# Patient Record
Sex: Female | Born: 1970 | Race: Black or African American | Hispanic: No | Marital: Married | Smoking: Never smoker
Health system: Southern US, Community
[De-identification: ages and names within clinical notes are randomized; demographics above are authoritative.]

## PROBLEM LIST (undated history)

## (undated) ENCOUNTER — Inpatient Hospital Stay (HOSPITAL_COMMUNITY): Payer: Self-pay

## (undated) DIAGNOSIS — F32A Depression, unspecified: Secondary | ICD-10-CM

## (undated) DIAGNOSIS — F419 Anxiety disorder, unspecified: Secondary | ICD-10-CM

## (undated) DIAGNOSIS — T7840XA Allergy, unspecified, initial encounter: Secondary | ICD-10-CM

## (undated) DIAGNOSIS — Z789 Other specified health status: Secondary | ICD-10-CM

## (undated) DIAGNOSIS — F329 Major depressive disorder, single episode, unspecified: Secondary | ICD-10-CM

## (undated) HISTORY — DX: Anxiety disorder, unspecified: F41.9

## (undated) HISTORY — PX: WISDOM TOOTH EXTRACTION: SHX21

## (undated) HISTORY — PX: HIATAL HERNIA REPAIR: SHX195

## (undated) HISTORY — DX: Allergy, unspecified, initial encounter: T78.40XA

---

## 1998-09-08 ENCOUNTER — Other Ambulatory Visit: Admission: RE | Admit: 1998-09-08 | Discharge: 1998-09-08 | Payer: Self-pay | Admitting: Obstetrics and Gynecology

## 1999-08-01 ENCOUNTER — Other Ambulatory Visit: Admission: RE | Admit: 1999-08-01 | Discharge: 1999-08-01 | Payer: Self-pay | Admitting: Obstetrics and Gynecology

## 2000-11-06 ENCOUNTER — Other Ambulatory Visit: Admission: RE | Admit: 2000-11-06 | Discharge: 2000-11-06 | Payer: Self-pay | Admitting: Obstetrics and Gynecology

## 2002-02-04 ENCOUNTER — Encounter: Admission: RE | Admit: 2002-02-04 | Discharge: 2002-02-04 | Payer: Self-pay | Admitting: Internal Medicine

## 2002-02-04 ENCOUNTER — Encounter: Payer: Self-pay | Admitting: Internal Medicine

## 2002-08-05 ENCOUNTER — Emergency Department (HOSPITAL_COMMUNITY): Admission: EM | Admit: 2002-08-05 | Discharge: 2002-08-06 | Payer: Self-pay | Admitting: Emergency Medicine

## 2005-03-27 ENCOUNTER — Encounter: Admission: RE | Admit: 2005-03-27 | Discharge: 2005-03-27 | Payer: Self-pay | Admitting: Obstetrics and Gynecology

## 2006-02-06 ENCOUNTER — Emergency Department (HOSPITAL_COMMUNITY): Admission: EM | Admit: 2006-02-06 | Discharge: 2006-02-06 | Payer: Self-pay | Admitting: Emergency Medicine

## 2007-06-03 ENCOUNTER — Emergency Department (HOSPITAL_COMMUNITY): Admission: EM | Admit: 2007-06-03 | Discharge: 2007-06-03 | Payer: Self-pay | Admitting: Emergency Medicine

## 2008-06-02 ENCOUNTER — Emergency Department (HOSPITAL_COMMUNITY): Admission: EM | Admit: 2008-06-02 | Discharge: 2008-06-02 | Payer: Self-pay | Admitting: Family Medicine

## 2008-07-22 ENCOUNTER — Ambulatory Visit (HOSPITAL_COMMUNITY): Admission: RE | Admit: 2008-07-22 | Discharge: 2008-07-22 | Payer: Self-pay | Admitting: Cardiovascular Disease

## 2008-09-22 ENCOUNTER — Emergency Department (HOSPITAL_COMMUNITY): Admission: EM | Admit: 2008-09-22 | Discharge: 2008-09-22 | Payer: Self-pay | Admitting: Family Medicine

## 2009-03-23 ENCOUNTER — Emergency Department (HOSPITAL_COMMUNITY): Admission: EM | Admit: 2009-03-23 | Discharge: 2009-03-23 | Payer: Self-pay | Admitting: Emergency Medicine

## 2010-05-13 LAB — POCT RAPID STREP A (OFFICE): Streptococcus, Group A Screen (Direct): NEGATIVE

## 2011-11-26 ENCOUNTER — Inpatient Hospital Stay (HOSPITAL_COMMUNITY)
Admission: AD | Admit: 2011-11-26 | Discharge: 2011-11-26 | Disposition: A | Discharge status: Home / Self Care | Payer: Managed Care, Other (non HMO) | Source: Ambulatory Visit | Attending: Obstetrics and Gynecology | Admitting: Obstetrics and Gynecology

## 2011-11-26 ENCOUNTER — Encounter (HOSPITAL_COMMUNITY): Payer: Self-pay | Admitting: *Deleted

## 2011-11-26 DIAGNOSIS — O200 Threatened abortion: Secondary | ICD-10-CM

## 2011-11-26 DIAGNOSIS — O209 Hemorrhage in early pregnancy, unspecified: Secondary | ICD-10-CM | POA: Insufficient documentation

## 2011-11-26 HISTORY — DX: Other specified health status: Z78.9

## 2011-11-26 LAB — URINALYSIS, ROUTINE W REFLEX MICROSCOPIC
Bilirubin Urine: NEGATIVE
Glucose, UA: NEGATIVE mg/dL
Ketones, ur: NEGATIVE mg/dL
Leukocytes, UA: NEGATIVE
Nitrite: NEGATIVE
Protein, ur: NEGATIVE mg/dL
Specific Gravity, Urine: 1.01 (ref 1.005–1.030)
Urobilinogen, UA: 0.2 mg/dL (ref 0.0–1.0)
pH: 8.5 — ABNORMAL HIGH (ref 5.0–8.0)

## 2011-11-26 LAB — WET PREP, GENITAL
Clue Cells Wet Prep HPF POC: NONE SEEN
Trich, Wet Prep: NONE SEEN
Yeast Wet Prep HPF POC: NONE SEEN

## 2011-11-26 NOTE — MAU Note (Signed)
Pt reports at 0100, when she went to the restroom she wiped and there was blood. Mild cramping,

## 2011-11-26 NOTE — MAU Note (Signed)
Bedside scan done. Two gestational sacs and both with cardiac activity seen.

## 2011-11-26 NOTE — MAU Provider Note (Signed)
Chief Complaint: Vaginal Bleeding   First Provider Initiated Contact with Patient 11/26/11 0249     SUBJECTIVE HPI: Kelli Rivers is a 41 y.o. G1P0 at [redacted]w[redacted]d by LMP (viable twin IUP verified by [place] 1 week ago per pt) who presents with light bleeding when she wiped at 0100 and mild cramping. Denies passage of tissue, vaginal discharge, hematuria, urinary Sx, recent intercourse. 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 Blood type A pos per pt.   Past Medical History  Diagnosis Date  . No pertinent past medical history    OB History    Grav Para Term Preterm Abortions TAB SAB Ect Mult Living   1              # Outc Date GA Lbr Len/2nd Wgt Sex Del Anes PTL Lv   1 CUR              Past Surgical History  Procedure Date  . Wisdom tooth extraction   . Hiatal hernia repair   . Hiatal hernia repair    History   Social History  . Marital Status: Married    Spouse Name: N/A    Number of Children: N/A  . Years of Education: N/A   Occupational History  . Not on file.   Social History Main Topics  . Smoking status: Never Smoker   . Smokeless tobacco: Not on file  . Alcohol Use: No  . Drug Use: No  . Sexually Active: Not Currently   Other Topics Concern  . Not on file   Social History Narrative  . No narrative on file   No current facility-administered medications on file prior to encounter.   No current outpatient prescriptions on file prior to encounter.   No Known Allergies  ROS: Pertinent items in HPI  OBJECTIVE Blood pressure 123/75, pulse 95, temperature 98.3 F (36.8 C), temperature source Oral, resp. rate 18, height 5\' 3"  (1.6 m), weight 49.442 kg (109 lb), last menstrual period 10/09/2011, SpO2 100.00%. GENERAL: Well-developed, well-nourished female in no acute distress.  HEENT: Normocephalic HEART: normal rate RESP: normal effort ABDOMEN: Soft, non-tender EXTREMITIES: Nontender, no edema NEURO: Alert and oriented SPECULUM EXAM: NEFG, small amount of  pink blood noted, cervix 0.5 cm ectropion, non-friable, Nabothian cyst at 2 o'clock. No active bleeding. No vaginal lesions or cervical polyps. BIMANUAL: cervix long and closed; uterus slightly enlarged, NT, no adnexal tenderness or masses  LAB RESULTS Results for orders placed during the hospital encounter of 11/26/11 (from the past 24 hour(s))  URINALYSIS, ROUTINE W REFLEX MICROSCOPIC     Status: Abnormal   Collection Time   11/26/11  1:50 AM      Component Value Range   Color, Urine YELLOW  YELLOW   APPearance HAZY (*) CLEAR   Specific Gravity, Urine 1.010  1.005 - 1.030   pH 8.5 (*) 5.0 - 8.0   Glucose, UA NEGATIVE  NEGATIVE mg/dL   Hgb urine dipstick LARGE (*) NEGATIVE   Bilirubin Urine NEGATIVE  NEGATIVE   Ketones, ur NEGATIVE  NEGATIVE mg/dL   Protein, ur NEGATIVE  NEGATIVE mg/dL   Urobilinogen, UA 0.2  0.0 - 1.0 mg/dL   Nitrite NEGATIVE  NEGATIVE   Leukocytes, UA NEGATIVE  NEGATIVE  URINE MICROSCOPIC-ADD ON     Status: Abnormal   Collection Time   11/26/11  1:50 AM      Component Value Range   Squamous Epithelial / LPF FEW (*) RARE   WBC, UA 0-2  <  3 WBC/hpf   RBC / HPF 3-6  <3 RBC/hpf  POCT PREGNANCY, URINE     Status: Abnormal   Collection Time   11/26/11  1:59 AM      Component Value Range   Preg Test, Ur POSITIVE (*) NEGATIVE  WET PREP, GENITAL     Status: Abnormal   Collection Time   11/26/11  3:00 AM      Component Value Range   Yeast Wet Prep HPF POC NONE SEEN  NONE SEEN   Trich, Wet Prep NONE SEEN  NONE SEEN   Clue Cells Wet Prep HPF POC NONE SEEN  NONE SEEN   WBC, Wet Prep HPF POC FEW (*) NONE SEEN    IMAGING FHR x 2 seen by informal BS [place].   MAU COURSE  ASSESSMENT No diagnosis found.  PLAN Discharge home per consult w/ Dr. Senaida Ores Pelvic rest x 1 week Bleeding precautions ABO/Rh pending Follow-up Information    Follow up with Fermin Schwab, MD. In 1 week. (as scheduled )    Contact information:   [facility]  [address] [place] [address] [phone]       Follow up with [facility] [place] [facility]ERN[facility] AD[facility]. (As needed if symptoms worsen)    Contact information:   [address] [id number] mc [place] [address] [phone]          Medication List     As of 11/26/2011  3:23 AM    ASK your doctor about these medications         folic acid 800 MCG tablet   Commonly known as: FOLVITE   Take 400 mcg by mouth daily.      prenatal multivitamin Tabs   Take 1 tablet by mouth daily.         [place], CNM 11/26/2011  3:23 AM

## 2011-11-27 LAB — GC/CHLAMYDIA PROBE AMP, GENITAL
Chlamydia, DNA Probe: NEGATIVE
GC Probe Amp, Genital: NEGATIVE

## 2011-12-18 LAB — OB RESULTS CONSOLE RUBELLA ANTIBODY, IGM: Rubella: IMMUNE

## 2011-12-18 LAB — OB RESULTS CONSOLE ANTIBODY SCREEN: Antibody Screen: NEGATIVE

## 2011-12-18 LAB — OB RESULTS CONSOLE HEPATITIS B SURFACE ANTIGEN: Hepatitis B Surface Ag: NEGATIVE

## 2011-12-18 LAB — OB RESULTS CONSOLE ABO/RH: RH Type: POSITIVE

## 2012-06-04 ENCOUNTER — Other Ambulatory Visit (HOSPITAL_COMMUNITY): Payer: Self-pay | Admitting: Obstetrics and Gynecology

## 2012-06-11 ENCOUNTER — Inpatient Hospital Stay (HOSPITAL_COMMUNITY): Payer: Managed Care, Other (non HMO)

## 2012-06-11 ENCOUNTER — Encounter (HOSPITAL_COMMUNITY): Payer: Self-pay | Admitting: Anesthesiology

## 2012-06-11 ENCOUNTER — Encounter (HOSPITAL_COMMUNITY): Payer: Self-pay

## 2012-06-11 ENCOUNTER — Inpatient Hospital Stay (HOSPITAL_COMMUNITY)
Admission: AD | Admit: 2012-06-11 | Discharge: 2012-06-15 | DRG: 765 | Disposition: A | Discharge status: Home / Self Care | Payer: Managed Care, Other (non HMO) | Source: Ambulatory Visit | Attending: Obstetrics and Gynecology | Admitting: Obstetrics and Gynecology

## 2012-06-11 ENCOUNTER — Inpatient Hospital Stay (HOSPITAL_COMMUNITY): Payer: Managed Care, Other (non HMO) | Admitting: Anesthesiology

## 2012-06-11 ENCOUNTER — Encounter (HOSPITAL_COMMUNITY)
Admission: AD | Disposition: A | Discharge status: Home / Self Care | Payer: Self-pay | Source: Ambulatory Visit | Attending: Obstetrics and Gynecology

## 2012-06-11 DIAGNOSIS — O30049 Twin pregnancy, dichorionic/diamniotic, unspecified trimester: Secondary | ICD-10-CM

## 2012-06-11 DIAGNOSIS — D252 Subserosal leiomyoma of uterus: Secondary | ICD-10-CM | POA: Diagnosis present

## 2012-06-11 DIAGNOSIS — O3090 Multiple gestation, unspecified, unspecified trimester: Principal | ICD-10-CM | POA: Diagnosis present

## 2012-06-11 DIAGNOSIS — D4959 Neoplasm of unspecified behavior of other genitourinary organ: Secondary | ICD-10-CM | POA: Diagnosis present

## 2012-06-11 DIAGNOSIS — O09529 Supervision of elderly multigravida, unspecified trimester: Secondary | ICD-10-CM | POA: Diagnosis present

## 2012-06-11 DIAGNOSIS — O34599 Maternal care for other abnormalities of gravid uterus, unspecified trimester: Secondary | ICD-10-CM | POA: Diagnosis present

## 2012-06-11 DIAGNOSIS — Z98891 History of uterine scar from previous surgery: Secondary | ICD-10-CM

## 2012-06-11 DIAGNOSIS — O30009 Twin pregnancy, unspecified number of placenta and unspecified number of amniotic sacs, unspecified trimester: Secondary | ICD-10-CM

## 2012-06-11 HISTORY — DX: Other specified health status: Z78.9

## 2012-06-11 LAB — CBC
MCH: 29.3 pg (ref 26.0–34.0)
MCHC: 34.1 g/dL (ref 30.0–36.0)
MCV: 85.9 fL (ref 78.0–100.0)
Platelets: 105 10*3/uL — ABNORMAL LOW (ref 150–400)
RDW: 17.9 % — ABNORMAL HIGH (ref 11.5–15.5)

## 2012-06-11 LAB — TYPE AND SCREEN: ABO/RH(D): A POS

## 2012-06-11 SURGERY — Surgical Case
Anesthesia: Spinal | Site: Abdomen | Wound class: Clean Contaminated

## 2012-06-11 MED ORDER — FAMOTIDINE IN NACL 20-0.9 MG/50ML-% IV SOLN
20.0000 mg | Freq: Once | INTRAVENOUS | Status: AC
  Administered 2012-06-11: 20 mg via INTRAVENOUS
  Filled 2012-06-11: qty 50

## 2012-06-11 MED ORDER — CITRIC ACID-SODIUM CITRATE 334-500 MG/5ML PO SOLN
ORAL | Status: AC
  Filled 2012-06-11: qty 15

## 2012-06-11 MED ORDER — CITRIC ACID-SODIUM CITRATE 334-500 MG/5ML PO SOLN: 30.0000 mL | Freq: Once | ORAL | Status: DC

## 2012-06-11 MED ORDER — LACTATED RINGERS IV SOLN
INTRAVENOUS | Status: DC
  Administered 2012-06-11: 125 mL/h via INTRAVENOUS
  Administered 2012-06-12 (×2): via INTRAVENOUS

## 2012-06-11 MED ORDER — FENTANYL CITRATE 0.05 MG/ML IJ SOLN
INTRAMUSCULAR | Status: DC | PRN
  Administered 2012-06-11: 25 ug via INTRATHECAL
  Administered 2012-06-12: 25 ug via INTRAVENOUS
  Administered 2012-06-12: 50 ug via INTRAVENOUS

## 2012-06-11 MED ORDER — MORPHINE SULFATE (PF) 0.5 MG/ML IJ SOLN
INTRAMUSCULAR | Status: DC | PRN
  Administered 2012-06-11: 150 ug via INTRATHECAL

## 2012-06-11 MED ORDER — BUPIVACAINE IN DEXTROSE 0.75-8.25 % IT SOLN
INTRATHECAL | Status: DC | PRN
  Administered 2012-06-11: 12 mg via INTRATHECAL

## 2012-06-11 MED ORDER — CEFAZOLIN SODIUM-DEXTROSE 2-3 GM-% IV SOLR
2.0000 g | Freq: Three times a day (TID) | INTRAVENOUS | Status: DC
  Administered 2012-06-11: 2 g via INTRAVENOUS
  Filled 2012-06-11 (×2): qty 50

## 2012-06-11 SURGICAL SUPPLY — 33 items
CLAMP CORD UMBIL (MISCELLANEOUS) ×6 IMPLANT
CLOTH BEACON ORANGE TIMEOUT ST (SAFETY) ×2 IMPLANT
CONTAINER PREFILL 10% NBF 15ML (MISCELLANEOUS) IMPLANT
DRAPE LG THREE QUARTER DISP (DRAPES) ×4 IMPLANT
DRSG OPSITE POSTOP 4X10 (GAUZE/BANDAGES/DRESSINGS) ×2 IMPLANT
DRSG VASELINE 3X18 (GAUZE/BANDAGES/DRESSINGS) ×2 IMPLANT
DURAPREP 26ML APPLICATOR (WOUND CARE) ×2 IMPLANT
ELECT REM PT RETURN 9FT ADLT (ELECTROSURGICAL) ×2
ELECTRODE REM PT RTRN 9FT ADLT (ELECTROSURGICAL) ×1 IMPLANT
EXTRACTOR VACUUM KIWI (MISCELLANEOUS) IMPLANT
EXTRACTOR VACUUM M CUP 4 TUBE (SUCTIONS) IMPLANT
GLOVE BIO SURGEON STRL SZ7.5 (GLOVE) ×2 IMPLANT
GOWN PREVENTION PLUS XLARGE (GOWN DISPOSABLE) ×2 IMPLANT
GOWN STRL REIN XL XLG (GOWN DISPOSABLE) ×4 IMPLANT
KIT ABG SYR 3ML LUER SLIP (SYRINGE) IMPLANT
NEEDLE HYPO 25X5/8 SAFETYGLIDE (NEEDLE) IMPLANT
NS IRRIG 1000ML POUR BTL (IV SOLUTION) ×2 IMPLANT
PACK C SECTION WH (CUSTOM PROCEDURE TRAY) ×2 IMPLANT
PAD OB MATERNITY 4.3X12.25 (PERSONAL CARE ITEMS) ×2 IMPLANT
RTRCTR C-SECT PINK 25CM LRG (MISCELLANEOUS) ×2 IMPLANT
SLEEVE SCD COMPRESS KNEE MED (MISCELLANEOUS) ×2 IMPLANT
STAPLER VISISTAT 35W (STAPLE) ×2 IMPLANT
SUT PLAIN 0 NONE (SUTURE) IMPLANT
SUT VIC AB 0 CT1 36 (SUTURE) ×12 IMPLANT
SUT VIC AB 3-0 CTX 36 (SUTURE) ×2 IMPLANT
SUT VIC AB 3-0 SH 27 (SUTURE)
SUT VIC AB 3-0 SH 27X BRD (SUTURE) IMPLANT
SUT VIC AB 4-0 KS 27 (SUTURE) IMPLANT
SUT VICRYL 0 TIES 12 18 (SUTURE) IMPLANT
SYR BULB 3OZ (MISCELLANEOUS) ×2 IMPLANT
TOWEL OR 17X24 6PK STRL BLUE (TOWEL DISPOSABLE) ×6 IMPLANT
TRAY FOLEY CATH 14FR (SET/KITS/TRAYS/PACK) ×2 IMPLANT
WATER STERILE IRR 1000ML POUR (IV SOLUTION) IMPLANT

## 2012-06-11 NOTE — MAU Note (Signed)
Pt reports she is 35 weeks with twins, having NST's and ultrasounds due to IUGR twin B. After visit today states she began having bleeding. Has not filled a pad yet.

## 2012-06-11 NOTE — Anesthesia Procedure Notes (Signed)
Spinal  Patient location during procedure: OR Start time: 06/11/2012 11:51 PM Staffing Anesthesiologist: Angus Seller., Harrell Gave. Performed by: anesthesiologist  Preanesthetic Checklist Completed: patient identified, site marked, surgical consent, pre-op evaluation, timeout performed, IV checked, risks and benefits discussed and monitors and equipment checked Spinal Block Patient position: sitting Prep: DuraPrep Patient monitoring: heart rate, cardiac monitor, continuous pulse ox and blood pressure Approach: midline Location: L3-4 Injection technique: single-shot Needle Needle type: Sprotte  Needle gauge: 24 G Needle length: 9 cm Assessment Sensory level: T4 Additional Notes Patient identified.  Risk benefits discussed including failed block, incomplete pain control, headache, nerve damage, paralysis, blood pressure changes, nausea, vomiting, reactions to medication both toxic or allergic, and postpartum back pain.  Patient expressed understanding and wished to proceed.  All questions were answered.  Sterile technique used throughout procedure.  CSF was clear.  No parasthesia or other complications.  Please see nursing notes for vital signs.

## 2012-06-11 NOTE — MAU Provider Note (Signed)
Chief Complaint:  Vaginal Bleeding  First Provider Initiated Contact with Patient 06/11/12 2158     HPI: Kelli Rivers is a 42 y.o. G1P0 at [redacted]w[redacted]d who presents to maternity admissions reporting red bleeding like a period this evening that had decreased and turned brown. Has not saturated a pad. Also reports worsening low abd cramping. Denies leakage of fluid. Good fetal movement.   Pregnancy Course: Twin pregnancy w/ growth discordance.  Past Medical History: Past Medical History  Diagnosis Date  . No pertinent past medical history   . Medical history non-contributory     Past obstetric history: OB History   Grav Para Term Preterm Abortions TAB SAB Ect Mult Living   1              # Outc Date GA Lbr Len/2nd Wgt Sex Del Anes PTL Lv   1 CUR               Past Surgical History: Past Surgical History  Procedure Laterality Date  . Wisdom tooth extraction    . Hiatal hernia repair    . Hiatal hernia repair      Family History: Family History  Problem Relation Age of Onset  . Hypertension Mother   . Hyperlipidemia Mother   . Thyroid disease Mother   . Hypertension Father   . Hyperlipidemia Father   . Heart disease Father   . Diabetes Maternal Aunt   . Diabetes Maternal Uncle   . Diabetes Paternal Aunt   . Diabetes Paternal Uncle   . Cancer Maternal Grandmother   . Diabetes Paternal Grandmother     Social History: History  Substance Use Topics  . Smoking status: Never Smoker   . Smokeless tobacco: Not on file  . Alcohol Use: No    Allergies:  Allergies  Allergen Reactions  . Vicodin (Hydrocodone-Acetaminophen) Nausea Only    Meds:  Prescriptions prior to admission  Medication Sig Dispense Refill  . Ferrous Sulfate 142 (45 FE) MG TBCR Take by mouth.      . folic acid (FOLVITE) 800 MCG tablet Take 400 mcg by mouth daily.      . Prenatal Vit-Fe Fumarate-FA (PRENATAL MULTIVITAMIN) TABS Take 1 tablet by mouth daily.        ROS: Pertinent findings in  history of present illness.  Physical Exam  Blood pressure 144/82, pulse 111, temperature 97.8 F (36.6 C), temperature source Oral, resp. rate 20, height 5\' 4"  (1.626 m), weight 58.968 kg (130 lb), last menstrual period 10/09/2011, SpO2 100.00%. GENERAL: Well-developed, well-nourished female in mild distress.  HEENT: normocephalic HEART: normal rate RESP: normal effort ABDOMEN: Soft, non-tender, gravid appropriate for gestational age EXTREMITIES: Nontender, no edema NEURO: alert and oriented SPECULUM EXAM: NEFG,small amount of old blood-mucus, cervix incompletely visualized, but engorged, friable.  Dilation: 3 Effacement (%): 80 Exam by:: V Jenilee Franey CNM Baby A Vertex  FHT:   A Baseline 130 , moderate variability, accelerations present, no decelerations  B Baseline 140 , moderate variability, accelerations present, mild variable decelerations Contractions: q 2-5 mins, mild   Labs: NA  Imaging:  Vtx/Breech  MAU Course: Dr. Ambrose Mantle came to assess pt. Cervix now 4 cm.   Assessment: Active labor Bloody show  Plan: Prep for OR per Dr. Ambrose Mantle.  [place], [place] 06/11/2012 10:29 PM

## 2012-06-11 NOTE — Anesthesia Preprocedure Evaluation (Signed)
Anesthesia Evaluation  Patient identified by MRN, date of birth, ID band Patient awake    Reviewed: Allergy & Precautions, H&P , NPO status , Patient's Chart, lab work & pertinent test results  Airway Mallampati: II      Dental no notable dental hx.    Pulmonary neg pulmonary ROS,  breath sounds clear to auscultation  Pulmonary exam normal       Cardiovascular Exercise Tolerance: Good negative cardio ROS  Rhythm:regular Rate:Normal     Neuro/Psych negative neurological ROS  negative psych ROS   GI/Hepatic negative GI ROS, Neg liver ROS,   Endo/Other  negative endocrine ROS  Renal/GU negative Renal ROS  negative genitourinary   Musculoskeletal   Abdominal Normal abdominal exam  (+)   Peds  Hematology negative hematology ROS (+)   Anesthesia Other Findings   Reproductive/Obstetrics (+) Pregnancy                           Anesthesia Physical Anesthesia Plan  ASA: II and emergent  Anesthesia Plan: Spinal   Post-op Pain Management:    Induction:   Airway Management Planned:   Additional Equipment:   Intra-op Plan:   Post-operative Plan:   Informed Consent: I have reviewed the patients History and Physical, chart, labs and discussed the procedure including the risks, benefits and alternatives for the proposed anesthesia with the patient or authorized representative who has indicated his/her understanding and acceptance.     Plan Discussed with: Anesthesiologist, CRNA and Surgeon  Anesthesia Plan Comments:         Anesthesia Quick Evaluation  

## 2012-06-12 LAB — RPR: RPR Ser Ql: NONREACTIVE

## 2012-06-12 MED ORDER — NALOXONE HCL 1 MG/ML IJ SOLN
1.0000 ug/kg/h | INTRAVENOUS | Status: DC | PRN
  Filled 2012-06-12: qty 2

## 2012-06-12 MED ORDER — ONDANSETRON HCL 4 MG PO TABS: 4.0000 mg | ORAL_TABLET | ORAL | Status: DC | PRN

## 2012-06-12 MED ORDER — OXYTOCIN 40 UNITS IN LACTATED RINGERS INFUSION - SIMPLE MED: 62.5000 mL/h | INTRAVENOUS | Status: DC

## 2012-06-12 MED ORDER — SIMETHICONE 80 MG PO CHEW: 80.0000 mg | CHEWABLE_TABLET | ORAL | Status: DC | PRN

## 2012-06-12 MED ORDER — MEPERIDINE HCL 25 MG/ML IJ SOLN: 6.2500 mg | INTRAMUSCULAR | Status: DC | PRN

## 2012-06-12 MED ORDER — SIMETHICONE 80 MG PO CHEW
80.0000 mg | CHEWABLE_TABLET | Freq: Three times a day (TID) | ORAL | Status: DC
  Administered 2012-06-12 – 2012-06-15 (×12): 80 mg via ORAL

## 2012-06-12 MED ORDER — METOCLOPRAMIDE HCL 5 MG/ML IJ SOLN: 10.0000 mg | Freq: Three times a day (TID) | INTRAMUSCULAR | Status: DC | PRN

## 2012-06-12 MED ORDER — ACETAMINOPHEN 10 MG/ML IV SOLN
1000.0000 mg | Freq: Four times a day (QID) | INTRAVENOUS | Status: AC | PRN
  Administered 2012-06-12: 1000 mg via INTRAVENOUS
  Filled 2012-06-12: qty 100

## 2012-06-12 MED ORDER — LACTATED RINGERS IV SOLN: INTRAVENOUS | Status: DC

## 2012-06-12 MED ORDER — IBUPROFEN 600 MG PO TABS
600.0000 mg | ORAL_TABLET | Freq: Four times a day (QID) | ORAL | Status: DC
  Administered 2012-06-12: 600 mg via ORAL
  Filled 2012-06-12: qty 1

## 2012-06-12 MED ORDER — MEASLES, MUMPS & RUBELLA VAC ~~LOC~~ INJ
0.5000 mL | INJECTION | Freq: Once | SUBCUTANEOUS | Status: DC
  Filled 2012-06-12: qty 0.5

## 2012-06-12 MED ORDER — ONDANSETRON HCL 4 MG/2ML IJ SOLN: 4.0000 mg | Freq: Three times a day (TID) | INTRAMUSCULAR | Status: DC | PRN

## 2012-06-12 MED ORDER — ONDANSETRON HCL 4 MG/2ML IJ SOLN: 4.0000 mg | INTRAMUSCULAR | Status: DC | PRN

## 2012-06-12 MED ORDER — LANOLIN HYDROUS EX OINT: 1.0000 "application " | TOPICAL_OINTMENT | CUTANEOUS | Status: DC | PRN

## 2012-06-12 MED ORDER — DIBUCAINE 1 % RE OINT: 1.0000 "application " | TOPICAL_OINTMENT | RECTAL | Status: DC | PRN

## 2012-06-12 MED ORDER — LACTATED RINGERS IV SOLN
INTRAVENOUS | Status: DC
  Administered 2012-06-12: 08:00:00 via INTRAVENOUS

## 2012-06-12 MED ORDER — SCOPOLAMINE 1 MG/3DAYS TD PT72
1.0000 | MEDICATED_PATCH | Freq: Once | TRANSDERMAL | Status: AC
  Administered 2012-06-12: 1.5 mg via TRANSDERMAL

## 2012-06-12 MED ORDER — NALOXONE HCL 0.4 MG/ML IJ SOLN: 0.4000 mg | INTRAMUSCULAR | Status: DC | PRN

## 2012-06-12 MED ORDER — DIPHENHYDRAMINE HCL 25 MG PO CAPS: 25.0000 mg | ORAL_CAPSULE | Freq: Four times a day (QID) | ORAL | Status: DC | PRN

## 2012-06-12 MED ORDER — TETANUS-DIPHTH-ACELL PERTUSSIS 5-2.5-18.5 LF-MCG/0.5 IM SUSP: 0.5000 mL | Freq: Once | INTRAMUSCULAR | Status: DC

## 2012-06-12 MED ORDER — COMPLETENATE 29-1 MG PO CHEW
1.0000 | CHEWABLE_TABLET | Freq: Every day | ORAL | Status: DC
  Administered 2012-06-13 – 2012-06-14 (×2): 1 via ORAL
  Filled 2012-06-12 (×3): qty 1

## 2012-06-12 MED ORDER — OXYTOCIN 10 UNIT/ML IJ SOLN
40.0000 [IU] | INTRAVENOUS | Status: DC | PRN
  Administered 2012-06-12: 40 [IU] via INTRAVENOUS

## 2012-06-12 MED ORDER — DIPHENHYDRAMINE HCL 25 MG PO CAPS
25.0000 mg | ORAL_CAPSULE | ORAL | Status: DC | PRN
  Administered 2012-06-12 – 2012-06-13 (×2): 25 mg via ORAL
  Filled 2012-06-12 (×2): qty 1

## 2012-06-12 MED ORDER — IBUPROFEN 100 MG/5ML PO SUSP
600.0000 mg | Freq: Four times a day (QID) | ORAL | Status: DC
  Administered 2012-06-12 – 2012-06-15 (×11): 600 mg via ORAL
  Filled 2012-06-12 (×12): qty 30

## 2012-06-12 MED ORDER — WITCH HAZEL-GLYCERIN EX PADS: 1.0000 "application " | MEDICATED_PAD | CUTANEOUS | Status: DC | PRN

## 2012-06-12 MED ORDER — DIPHENHYDRAMINE HCL 50 MG/ML IJ SOLN: 25.0000 mg | INTRAMUSCULAR | Status: DC | PRN

## 2012-06-12 MED ORDER — CEFAZOLIN SODIUM 1-5 GM-% IV SOLN
1.0000 g | Freq: Three times a day (TID) | INTRAVENOUS | Status: AC
  Administered 2012-06-12 (×2): 1 g via INTRAVENOUS
  Filled 2012-06-12 (×2): qty 50

## 2012-06-12 MED ORDER — ONDANSETRON HCL 4 MG/2ML IJ SOLN
INTRAMUSCULAR | Status: DC | PRN
  Administered 2012-06-12: 4 mg via INTRAVENOUS

## 2012-06-12 MED ORDER — DIPHENHYDRAMINE HCL 50 MG/ML IJ SOLN: 12.5000 mg | INTRAMUSCULAR | Status: DC | PRN

## 2012-06-12 MED ORDER — FENTANYL CITRATE 0.05 MG/ML IJ SOLN: 25.0000 ug | INTRAMUSCULAR | Status: DC | PRN

## 2012-06-12 MED ORDER — SENNOSIDES-DOCUSATE SODIUM 8.6-50 MG PO TABS
2.0000 | ORAL_TABLET | Freq: Every day | ORAL | Status: DC
  Administered 2012-06-12 – 2012-06-14 (×3): 2 via ORAL

## 2012-06-12 MED ORDER — SODIUM CHLORIDE 0.9 % IJ SOLN: 3.0000 mL | INTRAMUSCULAR | Status: DC | PRN

## 2012-06-12 MED ORDER — NALBUPHINE SYRINGE 5 MG/0.5 ML
5.0000 mg | INJECTION | INTRAMUSCULAR | Status: DC | PRN
  Filled 2012-06-12: qty 1

## 2012-06-12 MED ORDER — ZOLPIDEM TARTRATE 5 MG PO TABS
5.0000 mg | ORAL_TABLET | Freq: Every evening | ORAL | Status: DC | PRN
  Administered 2012-06-13 – 2012-06-14 (×2): 5 mg via ORAL
  Filled 2012-06-12 (×2): qty 1

## 2012-06-12 MED ORDER — OXYCODONE-ACETAMINOPHEN 5-325 MG/5ML PO SOLN
5.0000 mL | ORAL | Status: DC | PRN
  Administered 2012-06-12 – 2012-06-15 (×9): 5 mL via ORAL
  Filled 2012-06-12 (×9): qty 5

## 2012-06-12 MED ORDER — 0.9 % SODIUM CHLORIDE (POUR BTL) OPTIME
TOPICAL | Status: DC | PRN
  Administered 2012-06-12: 1000 mL

## 2012-06-12 MED ORDER — PROMETHAZINE HCL 25 MG/ML IJ SOLN: 6.2500 mg | INTRAMUSCULAR | Status: DC | PRN

## 2012-06-12 MED ORDER — MIDAZOLAM HCL 2 MG/2ML IJ SOLN: 0.5000 mg | Freq: Once | INTRAMUSCULAR | Status: DC | PRN

## 2012-06-12 MED ORDER — SCOPOLAMINE 1 MG/3DAYS TD PT72
MEDICATED_PATCH | TRANSDERMAL | Status: AC
  Filled 2012-06-12: qty 1

## 2012-06-12 MED ORDER — PRENATAL MULTIVITAMIN CH
1.0000 | ORAL_TABLET | Freq: Every day | ORAL | Status: DC
  Administered 2012-06-12: 1 via ORAL
  Filled 2012-06-12: qty 1

## 2012-06-12 MED ORDER — OXYCODONE-ACETAMINOPHEN 5-325 MG PO TABS
1.0000 | ORAL_TABLET | ORAL | Status: DC | PRN
  Administered 2012-06-12: 1 via ORAL
  Filled 2012-06-12: qty 2

## 2012-06-12 MED ORDER — MENTHOL 3 MG MT LOZG: 1.0000 | LOZENGE | OROMUCOSAL | Status: DC | PRN

## 2012-06-12 NOTE — Progress Notes (Signed)
Post Partum Day 0 Subjective: no complaints, up ad lib and tolerating PO--visited patient in NICU with babies  Objective: Blood pressure 106/74, pulse 94, temperature 97.5 F (36.4 C), temperature source Oral, resp. rate 18, height 5\' 4"  (1.626 m), weight 58.968 kg (130 lb), last menstrual period 10/09/2011, SpO2 100.00%.  Physical Exam:  General: alert and cooperative Lochia: appropriate Uterine Fundus: firm Incision: C/D/I    Recent Labs  06/11/12 2230  HGB 12.9  HCT 37.8    Assessment/Plan: Doing well babies stable in NICU  LOS: 1 day   Crystie Yanko W 06/12/2012, 11:19 AM

## 2012-06-12 NOTE — Op Note (Signed)
Operative note on Kelli Rivers  Date of the operation: 06/12/2012  Preoperative diagnosis intrauterine pregnancy 35 weeks 4 days, twin gestation, discordancy in growth with twin a 26% larger than twin B, elevated Doppler in twin B, vertex breech presentation  Postoperative diagnosis: Same with small subserosal uterine fibroids  Operation: Low transverse cervical C-section, twin A delivered vertex, twin B delivered breech  Operator: Jayquon Theiler  Anesthesia: Spinal, Dr. Brayton Caves  The patient was brought to the operating room in labor with the cervix 3-4 cm dilated and placed on the operating room table. She was positioned in the sitting position for a spinal anesthetic. The spinal was done by Dr. Jean Rosenthal and the patient was placed supine and tilted to her left. The RN then placed a Foley catheter into the bladder under sterile conditions and prepped the abdominal wall with DuraPrep. A timeout was done and the DuraPrep was allowed to dry for 3 minutes. The abdomen was then draped as a sterile field and anesthesia was confirmed by pinching the lower abdomen with an Allis clamp. A transverse incision was made suprapubically through the skin subcutaneous tissue and fascia, the fascia was separated from the rectus muscle superiorly and inferiorly, the peritoneum was opened vertically, and an Alexis retractor was placed to expose the lower uterine segment. A short transverse incision was made into the lower uterine segment through the superficial layers of the myometrium. I entered the amniotic sac with my finger and obtained clear fluid. The vertex of twin A was elevated into the incisional opening and the infant was delivered. The cord was clamped and cut and I took the baby to Dr. Joana Reamer who was awaiting his arrival. She assigned the baby Apgars of 9 and 9 at one and 5 minutes.I returned to the operating room table, found M.D. To be in breech presentation with the right foot complete breech and the  left foot frank.The infant was delivered without difficulty,the cord was clamped and cut and I took the baby to Dr. Joana Reamer. She assigned the baby Apgars of 9 and 9 at one and 5 minutes. Routine cord blood studies were obtained from each cord and the placenta was delivered and preserved for pathology. The inside of the uterus was inspected and found to be free of any products of conception and a 2 and ovaries were inspected and found to be normal. There were some small fibroids no more than 5-6 mm on the serosal surface of the uterus. The uterine incision was closed in 2 layers using a running lock suture of 0 Vicryl the first layer and nonlocking suture of the same  material on the second layer. There was a small hematoma at the right uterine angle but this was controlled with suture. One extra figure-of-eight suture was required for hemostasis in the central portion of the incision. The retractor was removed, liberal irrigation confirmed hemostasis, and the abdominal wound was closed in layers using interrupted sutures of 0 Vicryl to close the rectus muscle and peritoneum in one layer, 2 running sutures of 0 Vicryl on  the fascia, a running 3-0 Vicryl on  the subcutaneous tissue and staples on the skin. A sterile dressing was applied and the procedure was terminated. Estimated blood loss no more than 800 cc's sponge and needle counts were correct and the patient was returned to recovery in satisfactory condition

## 2012-06-12 NOTE — Anesthesia Postprocedure Evaluation (Signed)
  Anesthesia Post Note  Patient: Kelli Rivers  Procedure(s) Performed: Procedure(s) (LRB): CESAREAN SECTION (N/A)  Anesthesia type: Spinal  Patient location: PACU  Post pain: Pain level controlled  Post assessment: Post-op Vital signs reviewed  Last Vitals:  Filed Vitals:   06/11/12 2134  BP: 106/70  Pulse: 90  Temp:   Resp:     Post vital signs: Reviewed  Level of consciousness: awake  Complications: No apparent anesthesia complications
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
## 2012-06-12 NOTE — Consult Note (Signed)
Neonatology Note:   Attendance at C-section:    I was asked by Dr. Ambrose Mantle to attend this primary C/S at 35 2/[redacted] weeks GA due to onset of labor, twin gestation, with breech presentation in Twin B. The mother is a G1P0 A pos, GBS not done yet, with sickle trait and twins that grew concordantly until recently, when Twin B was noted to be about 25% smaller than Twin A. Amniotic fluid volume has been normal for both and the BPP has been normal, also. MFM has been following and had recommended delivery at 37 weeks, but she presented this evening with vaginal bleeding and onset of labor. ROM at delivery, fluid clear.  Twin A was delivered vertex and was vigorous with good spontaneous cry and tone. Needed only minimal bulb suctioning. Ap 9/9. Lungs clear to ausc in DR, but marked periodic breathing was noted with HR decelerations, so he was transported to the NICU for monitoring and further care after being held briefly by his parnts.  Twin B was delivered frank breech and was also vigorous with good tone and cry at birth. He required only bulb suctioning and remained pink. Ap 9/9. Held briefly in the DR by his parents, then transported to the NICU for further care due to LBW.  The father was in attendance throughout. I spoke with both parents before and after delivery about the babies, their condition, and our plans for their treatment.   Doretha Sou, MD

## 2012-06-12 NOTE — Anesthesia Postprocedure Evaluation (Signed)
  Anesthesia Post-op Note  Patient: Kelli Rivers  Procedure(s) Performed: Procedure(s) with comments: CESAREAN SECTION (N/A) - Primary Cesarean Section Twins Baby "A" Boy @ 0009, Apgars 9/9,  Baby "B" Boy @ 0011,   Apgars 9/9  Patient Location: PACU and Women's Unit  Anesthesia Type:Spinal  Level of Consciousness: awake  Airway and Oxygen Therapy: Patient Spontanous Breathing  Post-op Pain: none  Post-op Assessment: Patient's Cardiovascular Status Stable, Respiratory Function Stable, Patent Airway, No signs of Nausea or vomiting, Adequate PO intake, Pain level controlled, No headache, No backache, No residual numbness and No residual motor weakness  Post-op Vital Signs: Reviewed and stable  Complications: No apparent anesthesia complications

## 2012-06-12 NOTE — Transfer of Care (Signed)
Immediate Anesthesia Transfer of Care Note  Patient: Kelli Rivers  Procedure(s) Performed: Procedure(s) with comments: CESAREAN SECTION (N/A) - Primary Cesarean Section Twins Baby "A" Boy @ 0009, Apgars 9/9,  Baby "B" Boy @ 0011,   Apgars 9/9  Patient Location: PACU  Anesthesia Type:Spinal  Level of Consciousness: awake, alert  and oriented  Airway & Oxygen Therapy: Patient Spontanous Breathing  Post-op Assessment: Report given to PACU RN and Post -op Vital signs reviewed and stable  Post vital signs: Reviewed and stable  Complications: No apparent anesthesia complications

## 2012-06-13 LAB — CBC
MCV: 86.4 fL (ref 78.0–100.0)
Platelets: 100 10*3/uL — ABNORMAL LOW (ref 150–400)
RDW: 18 % — ABNORMAL HIGH (ref 11.5–15.5)
WBC: 7.4 10*3/uL (ref 4.0–10.5)

## 2012-06-13 NOTE — Clinical Social Work Maternal (Signed)
Clinical Social Work Department PSYCHOSOCIAL ASSESSMENT - MATERNAL/CHILD 06/13/2012  Patient:  Kelli Rivers, Kelli Rivers  Account Number:  [id number]  Admit Date:  06/11/2012  Marjo Bicker Name:   Shanna Cisco II (A)  Lanier Clam (B)    Clinical Social Worker:  Lulu Riding, [place]   Date/Time:  06/13/2012 10:00 AM  Date Referred:  06/13/2012   Referral source  NICU     Referred reason  NICU   Other referral source:    I:  FAMILY / HOME ENVIRONMENT Child's legal guardian:  PARENT  Guardian - Name Guardian - Age Guardian - Address  Flecia Shutter 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  FOB-Disability for heart attack 2.5 years ago.   Financial resources:  Media planner If [employer] - [place]:    School / Grade:   Maternity Care Coordinator / Child Services Coordination / Early Interventions:  Cultural issues impacting care:   None indicated    III  STRENGTHS Strengths  Adequate Resources  Compliance with medical plan  Home prepared for Child (including basic supplies)  Supportive family/friends  Understanding of illness   Strength comment:    IV  RISK FACTORS AND CURRENT PROBLEMS Current Problem:  None     V  SOCIAL WORK ASSESSMENT  CSW met with parents in MOB's third floor room/305 to introduce myself and complete assessment for NICU admission.  Parents were very friendly and both engaged in the conversation.  They report feeling well emotionally and that babies are doing well at this time.  They state they have everything they need for babies at home and a great support system.  FOB is not working due to disability and will be taking care of the  babies full time.  He states MOB's parents live near by and will be helping him when MOB goes back to work.  These babies are MOB's first, but FOB has an 32 year old daughter who lives with PGM in [place], [place] and a 73 year old son who lives with his mother in [place].  [place] states he sees these children on a regular basis.  CSW discussed signs and symptoms of PPD and MOB states no concerns at this time.  FOB states he has a Chief Operating Officer in Social Work and will make sure MOB seeks care if symptoms arise.  He went on to say that he has completed nursing school, but still has his clinicals to complete in order to graduate.  MOB has a Education administrator in [employer], but wants to go back to school to get a degree in Pharmacy.  CSW informed parents of baby b's eligibility for SSI due to gestational age and weight at birth.  They are interested in applying.  CSW assisted them in completing the application.  CSW explained ongoing support services offered by NICU CSW and asked parents to call if they have any questions or needs while their babies are in the NICU.  Parents were very appreciative.     VI SOCIAL WORK PLAN Social Work Plan  Psychosocial Support/Ongoing Assessment of Needs   Type of pt/family education:   PPD  SSI   If child protective services report - county:   If child  protective services report - date:   Information/referral to community resources comment:   No referral needs noted at this time.   Other social work plan:

## 2012-06-13 NOTE — Progress Notes (Signed)
Subjective: Postpartum Day 1 Cesarean Delivery Patient reports incisional pain, tolerating PO, + flatus and no problems voiding.  Working on breastfeeding  Objective: Vital signs in last 24 hours: Temp:  [97.1 F (36.2 C)-98.7 F (37.1 C)] 98.7 F (37.1 C) (04/19 0512) Pulse Rate:  [63-86] 73 (04/19 0512) Resp:  [16] 16 (04/19 0512) BP: (95-120)/(56-80) 95/56 mmHg (04/19 0512) SpO2:  [96 %-100 %] 96 % (04/19 0512)  Physical Exam:  General: alert and cooperative Lochia: appropriate Uterine Fundus: firm Incision: C/D/I    Recent Labs  06/11/12 2230 06/13/12 0605  HGB 12.9 11.2*  HCT 37.8 33.1*    Assessment/Plan: Status post Cesarean section. Doing well postoperatively.  Continue current care. Babies stable in NICU, working on feeds Julie Paolini W 06/13/2012, 10:58 AM

## 2012-06-13 NOTE — Plan of Care (Signed)
Problem: Discharge Progression Outcomes Goal: Activity appropriate for discharge plan Outcome: Completed/Met Date Met:  06/13/12 Walking in halls and tolerating well. Goal: Tolerating diet Outcome: Completed/Met Date Met:  06/13/12 Tolerating a Regular diet well. Goal: Pain controlled with appropriate interventions Outcome: Completed/Met Date Met:  06/13/12 Good pain control with po Motrin and Percocet. Goal: Discharge plan in place and appropriate Outcome: Completed/Met Date Met:  06/13/12 VSS Pain controlled Understands self care Understands when to call the Doctor Knows F/U care

## 2012-06-14 DIAGNOSIS — O30009 Twin pregnancy, unspecified number of placenta and unspecified number of amniotic sacs, unspecified trimester: Secondary | ICD-10-CM

## 2012-06-14 DIAGNOSIS — Z98891 History of uterine scar from previous surgery: Secondary | ICD-10-CM

## 2012-06-14 NOTE — Progress Notes (Signed)
Subjective: Postpartum Day 2 Cesarean Delivery Patient reports incisional pain, tolerating PO and no problems voiding.  Reports some mild intermittent dysuria with voiding--called pt in NICU as was not in room and I have a URI (did not want to enter NICU with germs!)  Objective: Vital signs in last 24 hours: Temp:  [98.2 F (36.8 C)-98.4 F (36.9 C)] 98.2 F (36.8 C) (04/20 0539) Pulse Rate:  [73-77] 77 (04/20 0539) Resp:  [16-20] 16 (04/20 0539) BP: (90-111)/(67-75) 110/71 mmHg (04/20 0539) SpO2:  [98 %-100 %] 99 % (04/20 0539)  Physical Exam:  General: alert and cooperative Lochia: appropriate Uterine Fundus: firm Incision: C/D/I--per RN who changed dressing    Recent Labs  06/11/12 2230 06/13/12 0605  HGB 12.9 11.2*  HCT 37.8 33.1*    Assessment/Plan: Status post Cesarean section. Doing well postoperatively.  Informed pt if dysuria worsens to let us know so we can check a urine culture. Continue current care. Plan probable d/c tomorrow.  Babies stable in NICU and feeding well.  Oliver Pila 06/14/2012, 10:25 AM

## 2012-06-15 ENCOUNTER — Encounter (HOSPITAL_COMMUNITY): Payer: Self-pay | Admitting: *Deleted

## 2012-06-15 ENCOUNTER — Encounter (HOSPITAL_COMMUNITY)
Admission: RE | Admit: 2012-06-15 | Discharge: 2012-06-15 | Disposition: A | Discharge status: Home / Self Care | Payer: Managed Care, Other (non HMO) | Source: Ambulatory Visit | Attending: Obstetrics and Gynecology | Admitting: Obstetrics and Gynecology

## 2012-06-15 DIAGNOSIS — O923 Agalactia: Secondary | ICD-10-CM | POA: Insufficient documentation

## 2012-06-15 MED ORDER — OXYCODONE-ACETAMINOPHEN 5-325 MG/5ML PO SOLN: 5.0000 mL | ORAL | Status: DC | PRN

## 2012-06-15 MED ORDER — IBUPROFEN 100 MG/5ML PO SUSP: 600.0000 mg | Freq: Four times a day (QID) | ORAL | Status: DC | PRN

## 2012-06-15 MED ORDER — COMPLETENATE 29-1 MG PO CHEW: 1.0000 | CHEWABLE_TABLET | Freq: Every day | ORAL | Status: DC

## 2012-06-15 NOTE — Progress Notes (Signed)
Post discharge review completed. 

## 2012-06-15 NOTE — Discharge Summary (Signed)
NAMEMIKU, UDALL              ACCOUNT NO.:  [id number]  MEDICAL RECORD NO.:  [id number]  LOCATION:  9305                          FACILITY:  [facility]  PHYSICIAN:  Malachi Pro. Ambrose Mantle, M.D. DATE OF BIRTH:  1970-08-07  DATE OF ADMISSION:  06/11/2012 DATE OF DISCHARGE:                              DISCHARGE SUMMARY   HISTORY OF PRESENT ILLNESS:  A 42 year old black female, para 0-0-2-0 gravida 3, EDC Jul 13, 2012, who was admitted in labor with a twin pregnancy conceived by intrauterine insemination at 35 weeks and 4 days. Vertex breech presentation, and discordancy in size with elevated Doppler in a small twin after admission to the hospital in MAU with the cervix 3-4 cm dilated and fully effaced, and the patient in labor.  The patient was taken directly to the operating room and underwent a low- transverse cervical C-section by Dr. Ambrose Mantle with delivery of twins weighing 5 pounds 0 ounces and 3 pounds 5 ounces.  Postpartum, the patient did well and no significant problems and was discharged on the third postoperative day.  At the time of discharge, she was afebrile, her blood pressure was normal.  She was passing flatus, tolerating a regular diet, ambulating well without difficulty.  DISCHARGE DIAGNOSES:  Intrauterine pregnancy at 35 weeks and 4 days with twins discordant in size and elevated Doppler in twin B, active labor.  OPERATION:  Low-transverse cervical C-section.  FINAL CONDITION:  Improved.  INSTRUCTIONS:  Include our regular discharge instruction booklet.  She is to continue her prenatal vitamins,  her folic acid and iron, Percocet 5/325 per 5 mL 1 teaspoon every 6 hours as needed for pain and Motrin 600 mg per 30 mL every 6 hours as needed for pain.  She is to return in 3 days to have her staples removed.     Malachi Pro. Ambrose Mantle, M.D.     TFH/MEDQ  D:  06/15/2012  T:  06/15/2012  Job:  [id number]

## 2012-06-15 NOTE — Progress Notes (Signed)
Patient ID: Kelli Rivers, female   DOB: 02-Jan-1971, 42 y.o.   MRN: [id number] #3 afebrile BP normal Tolerating a diet, passing flatus Abdomen soft and not tender For d/c.

## 2012-06-15 NOTE — Progress Notes (Signed)
Pt discharged to home with husband and parents.  Condition stable.  Pt ambulated to NICU with intentions to leave hospital from there.  Pt home with rented breast pump.  No equipment for home ordered at discharge.

## 2012-06-25 ENCOUNTER — Encounter (HOSPITAL_COMMUNITY): Payer: Self-pay | Admitting: *Deleted

## 2013-07-07 ENCOUNTER — Other Ambulatory Visit: Payer: Self-pay

## 2013-07-07 DIAGNOSIS — Z1231 Encounter for screening mammogram for malignant neoplasm of breast: Secondary | ICD-10-CM

## 2013-07-12 ENCOUNTER — Encounter (INDEPENDENT_AMBULATORY_CARE_PROVIDER_SITE_OTHER): Payer: Self-pay

## 2013-07-12 ENCOUNTER — Ambulatory Visit
Admission: RE | Admit: 2013-07-12 | Discharge: 2013-07-12 | Disposition: A | Discharge status: Home / Self Care | Payer: Managed Care, Other (non HMO) | Source: Ambulatory Visit

## 2013-07-12 DIAGNOSIS — Z1231 Encounter for screening mammogram for malignant neoplasm of breast: Secondary | ICD-10-CM

## 2013-12-27 ENCOUNTER — Encounter (HOSPITAL_COMMUNITY): Payer: Self-pay | Admitting: *Deleted

## 2014-03-27 ENCOUNTER — Emergency Department (HOSPITAL_COMMUNITY)
Admission: EM | Admit: 2014-03-27 | Discharge: 2014-03-27 | Disposition: A | Discharge status: Home / Self Care | Payer: BLUE CROSS/BLUE SHIELD | Attending: Emergency Medicine | Admitting: Emergency Medicine

## 2014-03-27 ENCOUNTER — Encounter (HOSPITAL_COMMUNITY): Payer: Self-pay | Admitting: Emergency Medicine

## 2014-03-27 DIAGNOSIS — Z79899 Other long term (current) drug therapy: Secondary | ICD-10-CM | POA: Insufficient documentation

## 2014-03-27 DIAGNOSIS — B349 Viral infection, unspecified: Secondary | ICD-10-CM | POA: Insufficient documentation

## 2014-03-27 DIAGNOSIS — R0981 Nasal congestion: Secondary | ICD-10-CM | POA: Diagnosis present

## 2014-03-27 DIAGNOSIS — H9209 Otalgia, unspecified ear: Secondary | ICD-10-CM | POA: Insufficient documentation

## 2014-03-27 MED ORDER — GUAIFENESIN-CODEINE 100-10 MG/5ML PO SOLN
5.0000 mL | ORAL | Status: DC | PRN
Start: 2014-03-27 — End: 2022-04-15

## 2014-03-27 NOTE — ED Notes (Signed)
Pt c/o clogged bilateral ears, states the cold is stuck in her throat and she can't cough in up. State she has had cold like sx x 2 weeks.

## 2014-03-27 NOTE — ED Provider Notes (Signed)
CSN: [id number]     Arrival date & time 03/27/14  1302 History  This chart was scribed for non-physician practitioner, Fransico Meadow, PA-C,  working with Orlie Dakin, MD, by Ian Bushman, ED Scribe. This patient was seen in room WTR6/WTR6 and the patient's care was started at 1:39 PM.    Chief Complaint  Patient presents with  . congestion    . Otalgia     (Consider location/radiation/quality/duration/timing/severity/associated sxs/prior Treatment) Patient is a 44 y.o. female presenting with ear pain. The history is provided by the patient. No language interpreter was used.  Otalgia Associated symptoms: cough and headaches     HPI Comments: Kelli Rivers is a 44 y.o. female who presents to the Emergency Department complaining of constant ear pain and cold like symptoms onset 2 weeks ago.  She has associated symptoms of coughing with brown phlegm, nausea and a headache/diziness. Patient was treated with tessalon pearls at her clinic and notes it did not help. Her doctor gave her Augmentin (which she is now out of) and Prednisone and states that nothing has alleviated her symptoms. Patient has had her flu shot.  Patients child was ill two weeks ago.     Past Medical History  Diagnosis Date  . No pertinent past medical history   . Medical history non-contributory    Past Surgical History  Procedure Laterality Date  . Wisdom tooth extraction    . Hiatal hernia repair    . Hiatal hernia repair    . Cesarean section N/A 06/11/2012    Procedure: CESAREAN SECTION;  Surgeon: Melina Schools, MD;  Location: [facility];  Service: Obstetrics;  Laterality: N/A;  Primary Cesarean Section Twins Baby "A" Boy @ 0009, Apgars 9/9,  Baby "B" Boy @ 0011,   Apgars 9/9   Family History  Problem Relation Age of Onset  . Hypertension Mother   . Hyperlipidemia Mother   . Thyroid disease Mother   . Hypertension Father   . Hyperlipidemia Father   . Heart disease Father   . Diabetes Maternal Aunt    . Diabetes Maternal Uncle   . Diabetes Paternal Aunt   . Diabetes Paternal Uncle   . Cancer Maternal Grandmother   . Diabetes Paternal Grandmother    History  Substance Use Topics  . Smoking status: Never Smoker   . Smokeless tobacco: Not on file  . Alcohol Use: No   OB History    Gravida Para Term Preterm AB TAB SAB Ectopic Multiple Living   1 1  1     1 2      Review of Systems  HENT: Positive for ear pain.   Respiratory: Positive for cough. Negative for shortness of breath, wheezing and stridor.   Cardiovascular: Negative for chest pain.  Gastrointestinal: Positive for nausea.  Neurological: Positive for headaches.  All other systems reviewed and are negative.     Allergies  Vicodin  Home Medications   Prior to Admission medications   Medication Sig Start Date End Date Taking? Authorizing Provider  Ferrous Sulfate 142 (45 FE) MG TBCR Take by mouth.    Historical Provider, MD  folic acid (FOLVITE) 735 MCG tablet Take 400 mcg by mouth daily.    Historical Provider, MD  ibuprofen (ADVIL,MOTRIN) 100 MG/5ML suspension Take 30 mLs (600 mg total) by mouth every 6 (six) hours as needed for fever. 06/15/12   Melina Schools, MD  oxyCODONE-acetaminophen (ROXICET) [phone] MG/5ML solution Take 5 mLs by mouth every 3 (three)  hours as needed (q6h). 06/15/12   Melina Schools, MD  prenatal vitamin w/FE, FA (NATACHEW) 29-1 MG CHEW Chew 1 tablet by mouth daily at 12 noon. 06/15/12   Melina Schools, MD   BP 137/87 mmHg  Pulse 80  Temp(Src) 98 F (36.7 C) (Oral)  Resp 16  SpO2 98%  LMP 03/09/2014 (Exact Date) Physical Exam  Constitutional: She is oriented to person, place, and time. She appears well-developed and well-nourished. No distress.  HENT:  Head: Normocephalic and atraumatic.  Right Ear: Tympanic membrane normal.  Left Ear: Tympanic membrane normal.  Naso conjestion    Eyes: Conjunctivae and EOM are normal.  Neck: Neck supple. No tracheal deviation present.   Cardiovascular: Normal rate.   Pulmonary/Chest: Effort normal and breath sounds normal. No respiratory distress.  Musculoskeletal: Normal range of motion.  Neurological: She is alert and oriented to person, place, and time.  Skin: Skin is warm and dry.  Psychiatric: She has a normal mood and affect. Her behavior is normal.  Nursing note and vitals reviewed.   ED Course  Procedures (including critical care time) DIAGNOSTIC STUDIES: Oxygen Saturation is 98% on RA, normal by my interpretation.    COORDINATION OF CARE: 1:44 PM Discussed treatment plan with patient at beside, the patient agrees with the plan and has no further questions at this time.   Labs Review Labs Reviewed - No data to display  Imaging Review No results found.   EKG Interpretation None      MDM  Pt has finished  Augmentin and prednisone.   I advised pt I think illness is viral and that cough may continue for several days.  Pt request medication for cough   Final diagnoses:  Viral illness    I personally performed the services in this documentation, which was scribed in my presence.  The recorded information has been reviewed and considered.   Ronnald Collum.   Fransico Meadow, PA-C 03/27/14 2002  Orlie Dakin, MD 03/28/14 1016

## 2014-03-27 NOTE — Discharge Instructions (Signed)
Cough, Adult  A cough is a reflex that helps clear your throat and airways. It can help heal the body or may be a reaction to an irritated airway. A cough may only last 2 or 3 weeks (acute) or may last more than 8 weeks (chronic).  CAUSES Acute cough:  Viral or bacterial infections. Chronic cough:  Infections.  Allergies.  Asthma.  Post-nasal drip.  Smoking.  Heartburn or acid reflux.  Some medicines.  Chronic lung problems (COPD).  Cancer. SYMPTOMS   Cough.  Fever.  Chest pain.  Increased breathing rate.  High-pitched whistling sound when breathing (wheezing).  Colored mucus that you cough up (sputum). TREATMENT   A bacterial cough may be treated with antibiotic medicine.  A viral cough must run its course and will not respond to antibiotics.  Your caregiver may recommend other treatments if you have a chronic cough. HOME CARE INSTRUCTIONS   Only take over-the-counter or prescription medicines for pain, discomfort, or fever as directed by your caregiver. Use cough suppressants only as directed by your caregiver.  Use a cold steam vaporizer or humidifier in your bedroom or home to help loosen secretions.  Sleep in a semi-upright position if your cough is worse at night.  Rest as needed.  Stop smoking if you smoke. SEEK IMMEDIATE MEDICAL CARE IF:   You have pus in your sputum.  Your cough starts to worsen.  You cannot control your cough with suppressants and are losing sleep.  You begin coughing up blood.  You have difficulty breathing.  You develop pain which is getting worse or is uncontrolled with medicine.  You have a fever. MAKE SURE YOU:   Understand these instructions.  Will watch your condition.  Will get help right away if you are not doing well or get worse. Document Released: 08/10/2010 Document Revised: 05/06/2011 Document Reviewed: 08/10/2010 ExitCare Patient Information 2015 [employer]. This information is not intended  to replace advice given to you by your health care provider. Make sure you discuss any questions you have with your health care provider.  

## 2014-06-22 ENCOUNTER — Encounter (HOSPITAL_COMMUNITY): Payer: Self-pay | Admitting: *Deleted

## 2014-06-22 ENCOUNTER — Emergency Department (HOSPITAL_COMMUNITY): Payer: BLUE CROSS/BLUE SHIELD

## 2014-06-22 ENCOUNTER — Emergency Department (HOSPITAL_COMMUNITY)
Admission: EM | Admit: 2014-06-22 | Discharge: 2014-06-23 | Disposition: A | Discharge status: Home / Self Care | Payer: BLUE CROSS/BLUE SHIELD | Attending: Emergency Medicine | Admitting: Emergency Medicine

## 2014-06-22 DIAGNOSIS — Z3202 Encounter for pregnancy test, result negative: Secondary | ICD-10-CM | POA: Insufficient documentation

## 2014-06-22 DIAGNOSIS — R1032 Left lower quadrant pain: Secondary | ICD-10-CM | POA: Diagnosis not present

## 2014-06-22 DIAGNOSIS — F329 Major depressive disorder, single episode, unspecified: Secondary | ICD-10-CM | POA: Insufficient documentation

## 2014-06-22 DIAGNOSIS — Z79899 Other long term (current) drug therapy: Secondary | ICD-10-CM | POA: Insufficient documentation

## 2014-06-22 DIAGNOSIS — R109 Unspecified abdominal pain: Secondary | ICD-10-CM | POA: Diagnosis present

## 2014-06-22 DIAGNOSIS — Z9889 Other specified postprocedural states: Secondary | ICD-10-CM | POA: Insufficient documentation

## 2014-06-22 DIAGNOSIS — R110 Nausea: Secondary | ICD-10-CM | POA: Insufficient documentation

## 2014-06-22 DIAGNOSIS — R319 Hematuria, unspecified: Secondary | ICD-10-CM

## 2014-06-22 HISTORY — DX: Depression, unspecified: F32.A

## 2014-06-22 HISTORY — DX: Major depressive disorder, single episode, unspecified: F32.9

## 2014-06-22 LAB — URINE MICROSCOPIC-ADD ON

## 2014-06-22 LAB — COMPREHENSIVE METABOLIC PANEL
ALBUMIN: 3.9 g/dL (ref 3.5–5.2)
ALT: 22 U/L (ref 0–35)
AST: 22 U/L (ref 0–37)
Alkaline Phosphatase: 61 U/L (ref 39–117)
Anion gap: 3 — ABNORMAL LOW (ref 5–15)
BUN: 8 mg/dL (ref 6–23)
CO2: 27 mmol/L (ref 19–32)
Calcium: 8.8 mg/dL (ref 8.4–10.5)
Chloride: 107 mmol/L (ref 96–112)
Creatinine, Ser: 0.75 mg/dL (ref 0.50–1.10)
GFR calc Af Amer: >90 mL/min (ref >90)
GFR calc non Af Amer: >90 mL/min (ref >90)
GLUCOSE: 94 mg/dL (ref 70–99)
POTASSIUM: 3.5 mmol/L (ref 3.5–5.1)
SODIUM: 137 mmol/L (ref 135–145)
TOTAL PROTEIN: 7.5 g/dL (ref 6.0–8.3)
Total Bilirubin: 0.5 mg/dL (ref 0.3–1.2)

## 2014-06-22 LAB — CBC WITH DIFFERENTIAL/PLATELET
Basophils Absolute: 0 10*3/uL (ref 0.0–0.1)
Basophils Relative: 0 % (ref 0–1)
EOS ABS: 0.1 10*3/uL (ref 0.0–0.7)
EOS PCT: 1 % (ref 0–5)
HEMATOCRIT: 38.2 % (ref 36.0–46.0)
HEMOGLOBIN: 12.3 g/dL (ref 12.0–15.0)
LYMPHS ABS: 1.9 10*3/uL (ref 0.7–4.0)
Lymphocytes Relative: 47 % — ABNORMAL HIGH (ref 12–46)
MCH: 28.2 pg (ref 26.0–34.0)
MCHC: 32.2 g/dL (ref 30.0–36.0)
MCV: 87.6 fL (ref 78.0–100.0)
MONO ABS: 0.4 10*3/uL (ref 0.1–1.0)
MONOS PCT: 10 % (ref 3–12)
NEUTROS PCT: 42 % — AB (ref 43–77)
Neutro Abs: 1.7 10*3/uL (ref 1.7–7.7)
Platelets: 321 10*3/uL (ref 150–400)
RBC: 4.36 MIL/uL (ref 3.87–5.11)
RDW: 13.9 % (ref 11.5–15.5)
WBC: 4 10*3/uL (ref 4.0–10.5)

## 2014-06-22 LAB — URINALYSIS, ROUTINE W REFLEX MICROSCOPIC
Bilirubin Urine: NEGATIVE
GLUCOSE, UA: NEGATIVE mg/dL
KETONES UR: NEGATIVE mg/dL
LEUKOCYTES UA: NEGATIVE
NITRITE: NEGATIVE
PROTEIN: NEGATIVE mg/dL
Specific Gravity, Urine: 1.014 (ref 1.005–1.030)
UROBILINOGEN UA: 1 mg/dL (ref 0.0–1.0)
pH: 7.5 (ref 5.0–8.0)

## 2014-06-22 LAB — POC URINE PREG, ED: PREG TEST UR: NEGATIVE

## 2014-06-22 LAB — LIPASE, BLOOD: LIPASE: 26 U/L (ref 11–59)

## 2014-06-22 MED ORDER — MORPHINE SULFATE 4 MG/ML IJ SOLN
4.0000 mg | Freq: Once | INTRAMUSCULAR | Status: AC
  Administered 2014-06-22: 4 mg via INTRAVENOUS
  Filled 2014-06-22: qty 1

## 2014-06-22 NOTE — ED Provider Notes (Signed)
CSN: [id number]     Arrival date & time 06/22/14  1946 History   First MD Initiated Contact with Patient 06/22/14 2202     Chief Complaint  Patient presents with  . Abdominal Pain     (Consider location/radiation/quality/duration/timing/severity/associated sxs/prior Treatment) Patient is a 44 y.o. female presenting with abdominal pain. The history is provided by the patient and a parent. No language interpreter was used.  Abdominal Pain Associated symptoms: no constipation, no dysuria, no fever, no hematuria, no vaginal bleeding, no vaginal discharge and no vomiting   Kelli Rivers is a 44 y.o female who presents with intermittent left sided abdominal pain and nausea for one week. The pain is non radiating. She states she has never had this before. Her last bowel movement was this morning. She denies any fever, chest pain, shortness of breath, vomiting, diarrhea, constipation, dysuria, or hematuria.  She has no history of kidney stones. Her LMP was 06/13/14.   Past Medical History  Diagnosis Date  . No pertinent past medical history   . Medical history non-contributory   . Depression    Past Surgical History  Procedure Laterality Date  . Wisdom tooth extraction    . Hiatal hernia repair    . Hiatal hernia repair    . Cesarean section N/A 06/11/2012    Procedure: CESAREAN SECTION;  Surgeon: Melina Schools, MD;  Location: [facility];  Service: Obstetrics;  Laterality: N/A;  Primary Cesarean Section Twins Baby "A" Boy @ 0009, Apgars 9/9,  Baby "B" Boy @ 0011,   Apgars 9/9   Family History  Problem Relation Age of Onset  . Hypertension Mother   . Hyperlipidemia Mother   . Thyroid disease Mother   . Hypertension Father   . Hyperlipidemia Father   . Heart disease Father   . Diabetes Maternal Aunt   . Diabetes Maternal Uncle   . Diabetes Paternal Aunt   . Diabetes Paternal Uncle   . Cancer Maternal Grandmother   . Diabetes Paternal Grandmother    History  Substance Use Topics  .  Smoking status: Never Smoker   . Smokeless tobacco: Not on file  . Alcohol Use: No   OB History    Gravida Para Term Preterm AB TAB SAB Ectopic Multiple Living   1 1  1     1 2      Review of Systems  Constitutional: Negative for fever.  Gastrointestinal: Positive for abdominal pain. Negative for vomiting, constipation and rectal pain.  Genitourinary: Negative for dysuria, hematuria, vaginal bleeding and vaginal discharge.  Neurological: Negative for dizziness.  All other systems reviewed and are negative.     Allergies  Vicodin  Home Medications   Prior to Admission medications   Medication Sig Start Date End Date Taking? Authorizing Provider  flintstones complete (FLINTSTONES) 60 MG chewable tablet Chew 1 tablet by mouth daily.   Yes Historical Provider, MD  loratadine-pseudoephedrine (CLARITIN-D 24-HOUR) 10-240 MG per 24 hr tablet Take 1 tablet by mouth daily.   Yes Historical Provider, MD  PARoxetine (PAXIL) 40 MG tablet Take 40 mg by mouth daily. 05/13/14  Yes Historical Provider, MD  traZODone (DESYREL) 50 MG tablet Take 50-75 mg by mouth at bedtime. 05/13/14  Yes Historical Provider, MD  TRI-PREVIFEM 0.18/0.215/0.25 MG-35 MCG tablet Take 1 tablet by mouth daily. 05/02/14  Yes Historical Provider, MD  Ferrous Sulfate 142 (45 FE) MG TBCR Take by mouth.    Historical Provider, MD  folic acid (FOLVITE) 952 MCG tablet Take  400 mcg by mouth daily.    Historical Provider, MD  guaiFENesin-codeine 100-10 MG/5ML syrup Take 5 mLs by mouth every 4 (four) hours as needed for cough. Patient not taking: Reported on 06/22/2014 03/27/14   Fransico Meadow, PA-C  ibuprofen (ADVIL,MOTRIN) 100 MG/5ML suspension Take 30 mLs (600 mg total) by mouth every 6 (six) hours as needed for fever. Patient not taking: Reported on 06/22/2014 06/15/12   Newton Pigg, MD  ondansetron [facility] ODT) 8 MG disintegrating tablet Take 1 tablet (8 mg total) by mouth every 8 (eight) hours as needed for nausea or vomiting.  06/23/14   Charlann Lange, PA-C  oxyCODONE-acetaminophen (ROXICET) 5-325 MG/5ML solution Take 5 mLs by mouth every 3 (three) hours as needed (q6h). Patient not taking: Reported on 06/22/2014 06/15/12   Newton Pigg, MD  prenatal vitamin w/FE, FA (NATACHEW) 29-1 MG CHEW Chew 1 tablet by mouth daily at 12 noon. Patient not taking: Reported on 06/22/2014 06/15/12   Newton Pigg, MD  traMADol (ULTRAM) 50 MG tablet Take 1 tablet (50 mg total) by mouth every 6 (six) hours as needed. 06/23/14   Shari Upstill, PA-C   BP 137/79 mmHg  Pulse 69  Temp(Src) 98.1 F (36.7 C) (Oral)  Resp 18  SpO2 99%  LMP 06/13/2014 Physical Exam  Constitutional: She is oriented to person, place, and time. She appears well-developed and well-nourished.  HENT:  Head: Normocephalic and atraumatic.  Eyes: Conjunctivae are normal.  Neck: Normal range of motion. Neck supple.  Cardiovascular: Normal rate, regular rhythm and normal heart sounds.   Pulmonary/Chest: Effort normal and breath sounds normal.  Abdominal: Soft. Normal appearance. She exhibits no distension and no mass. There is tenderness in the left lower quadrant.    Musculoskeletal: Normal range of motion.  Neurological: She is alert and oriented to person, place, and time.  Skin: Skin is warm and dry.  Nursing note and vitals reviewed.   ED Course  Procedures (including critical care time) Labs Review Labs Reviewed  CBC WITH DIFFERENTIAL/PLATELET - Abnormal; Notable for the following:    Neutrophils Relative % 42 (*)    Lymphocytes Relative 47 (*)    All other components within normal limits  COMPREHENSIVE METABOLIC PANEL - Abnormal; Notable for the following:    Anion gap 3 (*)    All other components within normal limits  URINALYSIS, ROUTINE W REFLEX MICROSCOPIC - Abnormal; Notable for the following:    APPearance CLOUDY (*)    Hgb urine dipstick LARGE (*)    All other components within normal limits  URINE MICROSCOPIC-ADD ON - Abnormal; Notable  for the following:    Squamous Epithelial / LPF FEW (*)    All other components within normal limits  LIPASE, BLOOD  POC URINE PREG, ED    Imaging Review Ct Abdomen Pelvis W Contrast  06/23/2014   CLINICAL DATA:  Acute onset of left-sided abdominal pain and bloating. Nausea and cramping. Initial encounter.  EXAM: CT ABDOMEN AND PELVIS WITH CONTRAST  TECHNIQUE: Multidetector CT imaging of the abdomen and pelvis was performed using the standard protocol following bolus administration of intravenous contrast.  CONTRAST:  148mL OMNIPAQUE IOHEXOL 300 MG/ML  SOLN  COMPARISON:  Pelvic ultrasound performed 06/11/2012  FINDINGS: The visualized lung bases are clear.  The liver and spleen are unremarkable in appearance. The gallbladder is within normal limits. The pancreas and adrenal glands are unremarkable.  The kidneys are unremarkable in appearance. There is no evidence of hydronephrosis. No renal or ureteral stones are  seen. No perinephric stranding is appreciated.  The small bowel is unremarkable in appearance. The stomach is within normal limits. No acute vascular abnormalities are seen.  The appendix is normal in caliber and contains air and contrast, without evidence of appendicitis. The colon is unremarkable in appearance.  The bladder is mildly distended and grossly unremarkable. An apparent 2.5 cm fibroid is seen at the anterior aspect of the uterus. The uterus is otherwise grossly unremarkable. No suspicious adnexal masses are seen. Trace fluid within the pelvis is likely physiologic in nature. No inguinal lymphadenopathy is seen.  No acute osseous abnormalities are identified.  IMPRESSION: 1. No acute abnormality seen within the abdomen or pelvis. 2. Small uterine fibroid noted.   Electronically Signed   By: Garald Balding M.D.   On: 06/23/2014 01:30     EKG Interpretation None      MDM   Final diagnoses:  Left lower quadrant pain  Hematuria  Patient presents for intermittent left lower  abdominal pain and nausea for the past week.  She is afebrile with no history of kidney stones. Her labs are unremarkable.  Her urine is positive for hgb but no UTI.  She is afebrile and her vitals are stable. I suspect this is a kidney stone and the plan is to send her home pending CT.   I spoke to and transferred care of the patient to Anderson Malta PA who will follow up with the pending CT.     Ottie Glazier, PA-C 06/23/14 1247  Leonard Schwartz, MD 06/23/14 [phone]

## 2014-06-22 NOTE — ED Notes (Signed)
Pt states that she began to have a abd pain / bloating last week; pt c/o cramping and left sided abd pain after eating; pt c/o nausea with no vomiting; pt denies diarrhea, pt states that she has been having small BM's and gave herself an enema to see if that relieved her bloating and discomfort

## 2014-06-23 ENCOUNTER — Encounter (HOSPITAL_COMMUNITY): Payer: Self-pay

## 2014-06-23 MED ORDER — TRAMADOL HCL 50 MG PO TABS: 50.0000 mg | ORAL_TABLET | Freq: Four times a day (QID) | ORAL | Status: DC | PRN

## 2014-06-23 MED ORDER — IOHEXOL 300 MG/ML  SOLN
100.0000 mL | Freq: Once | INTRAMUSCULAR | Status: AC | PRN
  Administered 2014-06-23: 100 mL via INTRAVENOUS

## 2014-06-23 MED ORDER — ONDANSETRON HCL 4 MG/2ML IJ SOLN
4.0000 mg | Freq: Once | INTRAMUSCULAR | Status: AC
  Administered 2014-06-23: 4 mg via INTRAVENOUS
  Filled 2014-06-23: qty 2

## 2014-06-23 MED ORDER — ONDANSETRON 8 MG PO TBDP
8.0000 mg | ORAL_TABLET | Freq: Three times a day (TID) | ORAL | Status: DC | PRN
Start: 2014-06-23 — End: 2022-04-15

## 2014-06-23 NOTE — Discharge Instructions (Signed)
Abdominal Pain Many things can cause abdominal pain. Usually, abdominal pain is not caused by a disease and will improve without treatment. It can often be observed and treated at home. Your health care provider will do a physical exam and possibly order blood tests and X-rays to help determine the seriousness of your pain. However, in many cases, more time must pass before a clear cause of the pain can be found. Before that point, your health care provider may not know if you need more testing or further treatment. HOME CARE INSTRUCTIONS  Monitor your abdominal pain for any changes. The following actions may help to alleviate any discomfort you are experiencing:  Only take over-the-counter or prescription medicines as directed by your health care provider.  Do not take laxatives unless directed to do so by your health care provider.  Try a clear liquid diet (broth, tea, or water) as directed by your health care provider. Slowly move to a bland diet as tolerated. SEEK MEDICAL CARE IF:  You have unexplained abdominal pain.  You have abdominal pain associated with nausea or diarrhea.  You have pain when you urinate or have a bowel movement.  You experience abdominal pain that wakes you in the night.  You have abdominal pain that is worsened or improved by eating food.  You have abdominal pain that is worsened with eating fatty foods.  You have a fever. SEEK IMMEDIATE MEDICAL CARE IF:   Your pain does not go away within 2 hours.  You keep throwing up (vomiting).  Your pain is felt only in portions of the abdomen, such as the right side or the left lower portion of the abdomen.  You pass bloody or black tarry stools. MAKE SURE YOU:  Understand these instructions.   Will watch your condition.   Will get help right away if you are not doing well or get worse.  Document Released: 11/21/2004 Document Revised: 02/16/2013 Document Reviewed: 10/21/2012 [facility] Patient Information  2015 [place], [place]. This information is not intended to replace advice given to you by your health care provider. Make sure you discuss any questions you have with your health care provider. Hematuria Hematuria is blood in your urine. It can be caused by a bladder infection, kidney infection, prostate infection, kidney stone, or cancer of your urinary tract. Infections can usually be treated with medicine, and a kidney stone usually will pass through your urine. If neither of these is the cause of your hematuria, further workup to find out the reason may be needed. It is very important that you tell your health care provider about any blood you see in your urine, even if the blood stops without treatment or happens without causing pain. Blood in your urine that happens and then stops and then happens again can be a symptom of a very serious condition. Also, pain is not a symptom in the initial stages of many urinary cancers. HOME CARE INSTRUCTIONS   Drink lots of fluid, 3-4 quarts a day. If you have been diagnosed with an infection, cranberry juice is especially recommended, in addition to large amounts of water.  Avoid caffeine, tea, and carbonated beverages because they tend to irritate the bladder.  Avoid alcohol because it may irritate the prostate.  Take all medicines as directed by your health care provider.  If you were prescribed an antibiotic medicine, finish it all even if you start to feel better.  If you have been diagnosed with a kidney stone, follow your health care  provider's instructions regarding straining your urine to catch the stone.  Empty your bladder often. Avoid holding urine for long periods of time.  After a bowel movement, women should cleanse front to back. Use each tissue only once.  Empty your bladder before and after sexual intercourse if you are a female. SEEK MEDICAL CARE IF:  You develop back pain.  You have a fever.  You have a feeling of sickness in  your stomach (nausea) or vomiting.  Your symptoms are not better in 3 days. Return sooner if you are getting worse. SEEK IMMEDIATE MEDICAL CARE IF:   You develop severe vomiting and are unable to keep the medicine down.  You develop severe back or abdominal pain despite taking your medicines.  You begin passing a large amount of blood or clots in your urine.  You feel extremely weak or faint, or you pass out. MAKE SURE YOU:   Understand these instructions.  Will watch your condition.  Will get help right away if you are not doing well or get worse. Document Released: 02/11/2005 Document Revised: 06/28/2013 Document Reviewed: 10/12/2012 [facility] Patient Information 2015 [place], [place]. This information is not intended to replace advice given to you by your health care provider. Make sure you discuss any questions you have with your health care provider.

## 2014-06-23 NOTE — ED Notes (Signed)
Patient transported to CT 

## 2014-06-23 NOTE — ED Provider Notes (Signed)
LLQ pain, hematuria, no fever Labs unremarkable Pending CT r/o stone  CT negative for acute process. Discussed results with patient and family. Recommended PCP follow up for recheck of abdominal pain and recheck of hematuria. Patient understands care plan and is ready for discharge.   Charlann Lange, PA-C 06/23/14 0350  Julianne Rice, MD 06/23/14 [phone]

## 2014-10-02 ENCOUNTER — Emergency Department (HOSPITAL_COMMUNITY): Payer: 59

## 2014-10-02 ENCOUNTER — Emergency Department (HOSPITAL_COMMUNITY)
Admission: EM | Admit: 2014-10-02 | Discharge: 2014-10-02 | Disposition: A | Discharge status: Home / Self Care | Payer: 59 | Attending: Emergency Medicine | Admitting: Emergency Medicine

## 2014-10-02 ENCOUNTER — Encounter (HOSPITAL_COMMUNITY): Payer: Self-pay | Admitting: *Deleted

## 2014-10-02 DIAGNOSIS — M7989 Other specified soft tissue disorders: Secondary | ICD-10-CM | POA: Insufficient documentation

## 2014-10-02 DIAGNOSIS — Z3202 Encounter for pregnancy test, result negative: Secondary | ICD-10-CM | POA: Insufficient documentation

## 2014-10-02 DIAGNOSIS — F329 Major depressive disorder, single episode, unspecified: Secondary | ICD-10-CM | POA: Diagnosis not present

## 2014-10-02 DIAGNOSIS — Z79899 Other long term (current) drug therapy: Secondary | ICD-10-CM | POA: Insufficient documentation

## 2014-10-02 DIAGNOSIS — M79604 Pain in right leg: Secondary | ICD-10-CM

## 2014-10-02 DIAGNOSIS — M25551 Pain in right hip: Secondary | ICD-10-CM | POA: Diagnosis not present

## 2014-10-02 LAB — POC URINE PREG, ED: Preg Test, Ur: NEGATIVE

## 2014-10-02 MED ORDER — ENOXAPARIN SODIUM 150 MG/ML ~~LOC~~ SOLN: 1.0000 mg/kg | Freq: Once | SUBCUTANEOUS | Status: DC

## 2014-10-02 MED ORDER — NAPROXEN 500 MG PO TABS: 500.0000 mg | ORAL_TABLET | Freq: Two times a day (BID) | ORAL | Status: DC

## 2014-10-02 MED ORDER — IBUPROFEN 400 MG PO TABS
400.0000 mg | ORAL_TABLET | Freq: Once | ORAL | Status: AC
  Administered 2014-10-02: 400 mg via ORAL

## 2014-10-02 MED ORDER — KETOROLAC TROMETHAMINE 60 MG/2ML IM SOLN
60.0000 mg | Freq: Once | INTRAMUSCULAR | Status: AC
  Administered 2014-10-02: 60 mg via INTRAMUSCULAR
  Filled 2014-10-02: qty 2

## 2014-10-02 MED ORDER — IBUPROFEN 400 MG PO TABS
ORAL_TABLET | ORAL | Status: AC
  Filled 2014-10-02: qty 1

## 2014-10-02 MED ORDER — ENOXAPARIN SODIUM 60 MG/0.6ML ~~LOC~~ SOLN
30.0000 mg | Freq: Once | SUBCUTANEOUS | Status: AC
  Administered 2014-10-02: 30 mg via SUBCUTANEOUS
  Filled 2014-10-02: qty 0.6

## 2014-10-02 NOTE — Discharge Instructions (Signed)
Take Naprosyn as needed for pain. Refer to attached documents for more information. Return to [facility] tomorrow for your ultrasound to rule out a DVT.

## 2014-10-02 NOTE — ED Notes (Signed)
Pt reports having pain to right leg for extended amount of time, thinks she has poor circulation or restless leg syndrome. Pt reports now having swelling to hip and pain when bearing weight on right leg.

## 2014-10-02 NOTE — ED Provider Notes (Signed)
8:06 PM Patient signed out to me by Abigail Butts, PA-C. Patient pending pregnancy test. If negative, she will be treated with IM lovenox for possible DVT and be discharged home with follow up in the morning for venous doppler to rule out DVT.   8:52 PM Pregnancy test negative. Patient will have 30mg  [facility] lovenox and instructions to return in the morning for her DVT study. Patient will have naprosyn as needed for pain and possible bursitis. Vitals stable and patient afebrile.   Results for orders placed or performed during the hospital encounter of 10/02/14  POC urine preg, ED (not at [facility])  Result Value Ref Range   Preg Test, Ur NEGATIVE NEGATIVE   Dg Hip Unilat With Pelvis 2-3 Views Right  10/02/2014   CLINICAL DATA:  Pain and swelling.  No history of trauma  EXAM: DG HIP (WITH OR WITHOUT PELVIS) 2-3V RIGHT  COMPARISON:  None.  FINDINGS: Frontal pelvis as well as frontal and lateral right hip images were obtained. There is no fracture or dislocation. Joint spaces appear intact. No erosive change.  IMPRESSION: No fracture or dislocation.  No appreciable arthropathy.   Electronically Signed   By: Lowella Grip III M.D.   On: 10/02/2014 19:53      Alvina Chou, PA-C 10/02/14 2054  Virgel Manifold, MD 10/02/14 [phone]

## 2014-10-02 NOTE — ED Provider Notes (Signed)
CSN: [id number]     Arrival date & time 10/02/14  1716 History   First MD Initiated Contact with Patient 10/02/14 1900     Chief Complaint  Patient presents with  . Leg Pain     (Consider location/radiation/quality/duration/timing/severity/associated sxs/prior Treatment) Patient is a 44 y.o. female presenting with leg pain. The history is provided by the patient and medical records. No language interpreter was used.  Leg Pain Associated symptoms: no back pain, no fatigue and no fever      Kelli Rivers is a 44 y.o. female  with a hx of depression presents to the Emergency Department complaining of gradual, persistent, progressively worsening leg pain onset several months ago with acute swelling noticed this morning. Patient reports that bearing weight on the right leg worsens the right hip pain and palpation of the calf worsens the lower hip pain. Patient reports using warm compresses, attempting massage and taking Tylenol and ibuprofen without relief. She denies history of travel, immobilization, surgeries, broken bones or history of DVT. She is 2 years postpartum. LMP: 1 month ago. Patient is sexually active. She reports oral contraception pills which she was compliant with until approximately one month ago.  She denies falls or trauma to the leg. She denies redness, rash, no tick bites, chest pain, shortness of breath, dyspnea on exertion, hemoptysis, abdominal pain, nausea, vomiting, diarrhea, weakness, dizziness, syncope.  Past Medical History  Diagnosis Date  . No pertinent past medical history   . Medical history non-contributory   . Depression    Past Surgical History  Procedure Laterality Date  . Wisdom tooth extraction    . Hiatal hernia repair    . Hiatal hernia repair    . Cesarean section N/A 06/11/2012    Procedure: CESAREAN SECTION;  Surgeon: Melina Schools, MD;  Location: [facility];  Service: Obstetrics;  Laterality: N/A;  Primary Cesarean Section Twins Baby "A" Boy @  0009, Apgars 9/9,  Baby "B" Boy @ 0011,   Apgars 9/9   Family History  Problem Relation Age of Onset  . Hypertension Mother   . Hyperlipidemia Mother   . Thyroid disease Mother   . Hypertension Father   . Hyperlipidemia Father   . Heart disease Father   . Diabetes Maternal Aunt   . Diabetes Maternal Uncle   . Diabetes Paternal Aunt   . Diabetes Paternal Uncle   . Cancer Maternal Grandmother   . Diabetes Paternal Grandmother    History  Substance Use Topics  . Smoking status: Never Smoker   . Smokeless tobacco: Not on file  . Alcohol Use: No   OB History    Gravida Para Term Preterm AB TAB SAB Ectopic Multiple Living   1 1  1     1 2      Review of Systems  Constitutional: Negative for fever, diaphoresis, appetite change, fatigue and unexpected weight change.  HENT: Negative for mouth sores.   Eyes: Negative for visual disturbance.  Respiratory: Negative for cough, chest tightness, shortness of breath and wheezing.   Cardiovascular: Positive for leg swelling. Negative for chest pain.  Gastrointestinal: Negative for nausea, vomiting, abdominal pain, diarrhea and constipation.  Endocrine: Negative for polydipsia, polyphagia and polyuria.  Genitourinary: Negative for dysuria, urgency, frequency and hematuria.  Musculoskeletal: Positive for arthralgias (right hip, right leg). Negative for back pain and neck stiffness.  Skin: Negative for rash.  Allergic/Immunologic: Negative for immunocompromised state.  Neurological: Negative for syncope, light-headedness and headaches.  Hematological: Does not  bruise/bleed easily.  Psychiatric/Behavioral: Negative for sleep disturbance. The patient is not nervous/anxious.       Allergies  Oxycodone and Vicodin  Home Medications   Prior to Admission medications   Medication Sig Start Date End Date Taking? Authorizing Provider  Ferrous Sulfate 142 (45 FE) MG TBCR Take by mouth.    Historical Provider, MD  flintstones complete  (FLINTSTONES) 60 MG chewable tablet Chew 1 tablet by mouth daily.    Historical Provider, MD  folic acid (FOLVITE) 211 MCG tablet Take 400 mcg by mouth daily.    Historical Provider, MD  guaiFENesin-codeine 100-10 MG/5ML syrup Take 5 mLs by mouth every 4 (four) hours as needed for cough. Patient not taking: Reported on 06/22/2014 03/27/14   Fransico Meadow, PA-C  ibuprofen (ADVIL,MOTRIN) 100 MG/5ML suspension Take 30 mLs (600 mg total) by mouth every 6 (six) hours as needed for fever. Patient not taking: Reported on 06/22/2014 06/15/12   Newton Pigg, MD  loratadine-pseudoephedrine (CLARITIN-D 24-HOUR) 10-240 MG per 24 hr tablet Take 1 tablet by mouth daily.    Historical Provider, MD  ondansetron (ZOFRAN ODT) 8 MG disintegrating tablet Take 1 tablet (8 mg total) by mouth every 8 (eight) hours as needed for nausea or vomiting. 06/23/14   Charlann Lange, PA-C  oxyCODONE-acetaminophen (ROXICET) 5-325 MG/5ML solution Take 5 mLs by mouth every 3 (three) hours as needed (q6h). Patient not taking: Reported on 06/22/2014 06/15/12   Newton Pigg, MD  PARoxetine (PAXIL) 40 MG tablet Take 40 mg by mouth daily. 05/13/14   Historical Provider, MD  prenatal vitamin w/FE, FA (NATACHEW) 29-1 MG CHEW Chew 1 tablet by mouth daily at 12 noon. Patient not taking: Reported on 06/22/2014 06/15/12   Newton Pigg, MD  traMADol (ULTRAM) 50 MG tablet Take 1 tablet (50 mg total) by mouth every 6 (six) hours as needed. 06/23/14   Charlann Lange, PA-C  traZODone (DESYREL) 50 MG tablet Take 50-75 mg by mouth at bedtime. 05/13/14   Historical Provider, MD  TRI-PREVIFEM 0.18/0.215/0.25 MG-35 MCG tablet Take 1 tablet by mouth daily. 05/02/14   Historical Provider, MD   BP 121/73 mmHg  Pulse 88  Temp(Src) 97.8 F (36.6 C) (Oral)  Resp 18  Ht 5\' 4"  (1.626 m)  Wt 118 lb (53.524 kg)  BMI 20.24 kg/m2  SpO2 100%  LMP 09/05/2014 Physical Exam  Constitutional: She appears well-developed and well-nourished. No distress.  HENT:  Head:  Normocephalic and atraumatic.  Eyes: Conjunctivae are normal.  Neck: Normal range of motion.  Cardiovascular: Normal rate, regular rhythm, normal heart sounds and intact distal pulses.   No murmur heard. Capillary refill < 3 sec No tachycardia  Pulmonary/Chest: Effort normal and breath sounds normal.  Clear and equal breath sounds  Musculoskeletal: She exhibits tenderness. She exhibits no edema.  ROM: Full range of motion of the right hip, knee, ankle and all toes of the right foot Significant tenderness to palpation along the trochanteric bursa No erythema, increased warmth or overlying induration Negative Homans sign Mild tenderness palpation of the right calf muscle Minimal swelling noted to the entire right extremity when compared to the left without pitting edema  Neurological: She is alert. Coordination normal.  Sensation intact and on sharp in the bilateral lower extremities Strength 5/5 in the bilateral lower extremities including flexion, extension Abduction and adduction of the right hip with pain in the Abduction testing  Skin: Skin is warm and dry. She is not diaphoretic.  No tenting of the skin  Psychiatric:  She has a normal mood and affect.  Nursing note and vitals reviewed.   ED Course  Procedures (including critical care time) Labs Review Labs Reviewed  POC URINE PREG, ED    Imaging Review Dg Hip Unilat With Pelvis 2-3 Views Right  10/02/2014   CLINICAL DATA:  Pain and swelling.  No history of trauma  EXAM: DG HIP (WITH OR WITHOUT PELVIS) 2-3V RIGHT  COMPARISON:  None.  FINDINGS: Frontal pelvis as well as frontal and lateral right hip images were obtained. There is no fracture or dislocation. Joint spaces appear intact. No erosive change.  IMPRESSION: No fracture or dislocation.  No appreciable arthropathy.   Electronically Signed   By: Lowella Grip III M.D.   On: 10/02/2014 19:53     EKG Interpretation None      MDM   Final diagnoses:  Leg pain,  diffuse, right  Right leg swelling   Kelli Rivers presents with right leg pain for several months but now with swelling. Patient with slightly increased risk for DVT with oral contraception usage.  Will obtain x-ray to rule out AVN or trochanteric hairline fracture. Doubt fracture as pain is atraumatic.  Pain may be 2/2 trochanteric bursitis.   Unable to obtain venous duplex in the emergency department at this time of the evening. If pregnancy test is negative patient will be given Lovenox and have outpatient Dopplers. Patient has primary care for which she can follow-up within one week.  8:18 PM X-ray without evidence of fracture or AVN.  Preg test pending.  If neg, will give Lovenox and schedule outpatient venous duplex in the AM.   Care transferred to [facility], PA-C who will follow and dispo with plans for outpatient follow-up.   BP 121/73 mmHg  Pulse 88  Temp(Src) 97.8 F (36.6 C) (Oral)  Resp 18  Ht 5\' 4"  (1.626 m)  Wt 118 lb (53.524 kg)  BMI 20.24 kg/m2  SpO2 100%  LMP 09/05/2014    Jarrett Soho Ginni Eichler, PA-C 10/02/14 2018  Virgel Manifold, MD 10/02/14 2055

## 2014-10-03 ENCOUNTER — Emergency Department (HOSPITAL_COMMUNITY)
Admission: EM | Admit: 2014-10-03 | Discharge: 2014-10-03 | Disposition: A | Discharge status: Home / Self Care | Payer: 59

## 2014-10-03 ENCOUNTER — Ambulatory Visit (HOSPITAL_BASED_OUTPATIENT_CLINIC_OR_DEPARTMENT_OTHER)
Admission: RE | Admit: 2014-10-03 | Discharge: 2014-10-03 | Disposition: A | Discharge status: Home / Self Care | Payer: 59 | Source: Ambulatory Visit | Attending: Emergency Medicine | Admitting: Emergency Medicine

## 2014-10-03 DIAGNOSIS — M79609 Pain in unspecified limb: Secondary | ICD-10-CM

## 2014-10-03 DIAGNOSIS — M79604 Pain in right leg: Secondary | ICD-10-CM

## 2014-10-03 NOTE — Progress Notes (Signed)
Preliminary results by tech - Right Lower Ext. Venous Duplex Completed. Negative for deep and superficial vein thrombosis in the right lower extremity.  Oda Cogan, BS, RDMS, RVT

## 2014-10-19 ENCOUNTER — Other Ambulatory Visit: Payer: Self-pay

## 2014-10-19 DIAGNOSIS — Z1231 Encounter for screening mammogram for malignant neoplasm of breast: Secondary | ICD-10-CM

## 2014-12-19 ENCOUNTER — Ambulatory Visit
Admission: RE | Admit: 2014-12-19 | Discharge: 2014-12-19 | Disposition: A | Discharge status: Home / Self Care | Payer: 59 | Source: Ambulatory Visit

## 2014-12-19 DIAGNOSIS — Z1231 Encounter for screening mammogram for malignant neoplasm of breast: Secondary | ICD-10-CM

## 2016-02-22 ENCOUNTER — Other Ambulatory Visit: Payer: Self-pay | Admitting: Obstetrics and Gynecology

## 2016-02-22 DIAGNOSIS — Z1231 Encounter for screening mammogram for malignant neoplasm of breast: Secondary | ICD-10-CM

## 2016-02-29 ENCOUNTER — Ambulatory Visit
Admission: RE | Admit: 2016-02-29 | Discharge: 2016-02-29 | Disposition: A | Discharge status: Home / Self Care | Payer: 59 | Source: Ambulatory Visit | Attending: Obstetrics and Gynecology | Admitting: Obstetrics and Gynecology

## 2016-02-29 DIAGNOSIS — Z1231 Encounter for screening mammogram for malignant neoplasm of breast: Secondary | ICD-10-CM

## 2017-02-26 ENCOUNTER — Other Ambulatory Visit: Payer: Self-pay | Admitting: Obstetrics and Gynecology

## 2017-02-26 DIAGNOSIS — Z1231 Encounter for screening mammogram for malignant neoplasm of breast: Secondary | ICD-10-CM

## 2017-03-14 ENCOUNTER — Ambulatory Visit
Admission: RE | Admit: 2017-03-14 | Discharge: 2017-03-14 | Disposition: A | Discharge status: Home / Self Care | Payer: 59 | Source: Ambulatory Visit | Attending: Obstetrics and Gynecology | Admitting: Obstetrics and Gynecology

## 2017-03-14 DIAGNOSIS — Z1231 Encounter for screening mammogram for malignant neoplasm of breast: Secondary | ICD-10-CM

## 2018-01-14 ENCOUNTER — Emergency Department (HOSPITAL_COMMUNITY)
Admission: EM | Admit: 2018-01-14 | Discharge: 2018-01-14 | Disposition: A | Discharge status: Home / Self Care | Payer: 59 | Attending: Emergency Medicine | Admitting: Emergency Medicine

## 2018-01-14 ENCOUNTER — Encounter (HOSPITAL_COMMUNITY): Payer: Self-pay

## 2018-01-14 ENCOUNTER — Other Ambulatory Visit: Payer: Self-pay

## 2018-01-14 DIAGNOSIS — J029 Acute pharyngitis, unspecified: Secondary | ICD-10-CM | POA: Insufficient documentation

## 2018-01-14 DIAGNOSIS — Z79899 Other long term (current) drug therapy: Secondary | ICD-10-CM | POA: Diagnosis not present

## 2018-01-14 DIAGNOSIS — R110 Nausea: Secondary | ICD-10-CM | POA: Insufficient documentation

## 2018-01-14 MED ORDER — IBUPROFEN 600 MG PO TABS: 600.0000 mg | ORAL_TABLET | Freq: Four times a day (QID) | ORAL | 0 refills | Status: DC | PRN

## 2018-01-14 NOTE — ED Triage Notes (Signed)
Pt with sore throat and nausea starting today. Denies fever

## 2018-01-14 NOTE — ED Provider Notes (Signed)
[facility] EMERGENCY DEPARTMENT Provider Note   CSN: [id number] Arrival date & time: 01/14/18  4268     History   Chief Complaint Chief Complaint  Patient presents with  . Sore Throat    HPI Kelli Rivers is a 47 y.o. female.  Patient to ED for evaluation of sore throat that started last evening. She had some mild nausea without vomiting. No rash, congestion or fever. No history of Strep. She has tried ibuprofen, Tylenol, salt water gargles and lozenges without relief.   The history is provided by the patient. No language interpreter was used.  Sore Throat  Pertinent negatives include no abdominal pain and no headaches.    Past Medical History:  Diagnosis Date  . Depression   . Medical history non-contributory   . No pertinent past medical history     Patient Active Problem List   Diagnosis Date Noted  . Twin pregnancy delivered 06/14/2012  . Twin pregnancy, twins discordant 06/14/2012  . S/P primary low transverse C-section 06/14/2012    Past Surgical History:  Procedure Laterality Date  . CESAREAN SECTION N/A 06/11/2012   Procedure: CESAREAN SECTION;  Surgeon: Melina Schools, MD;  Location: [facility];  Service: Obstetrics;  Laterality: N/A;  Primary Cesarean Section Twins Baby "A" Boy @ 0009, Apgars 9/9,  Baby "B" Boy @ 0011,   Apgars 9/9  . HIATAL HERNIA REPAIR    . HIATAL HERNIA REPAIR    . WISDOM TOOTH EXTRACTION       OB History    Gravida  1   Para  1   Term      Preterm  1   AB      Living  2     SAB      TAB      Ectopic      Multiple  1   Live Births  2            Home Medications    Prior to Admission medications   Medication Sig Start Date End Date Taking? Authorizing Provider  Ferrous Sulfate 142 (45 FE) MG TBCR Take by mouth.    [provider]  flintstones complete (FLINTSTONES) 60 MG chewable tablet Chew 1 tablet by mouth daily.    [provider]  folic acid (FOLVITE) 341 MCG  tablet Take 400 mcg by mouth daily.    [provider]  guaiFENesin-codeine 100-10 MG/5ML syrup Take 5 mLs by mouth every 4 (four) hours as needed for cough. Patient not taking: Reported on 06/22/2014 03/27/14   Fransico Meadow, PA-C  ibuprofen (ADVIL,MOTRIN) 100 MG/5ML suspension Take 30 mLs (600 mg total) by mouth every 6 (six) hours as needed for fever. Patient not taking: Reported on 06/22/2014 06/15/12   Newton Pigg, MD  loratadine-pseudoephedrine (CLARITIN-D 24-HOUR) 10-240 MG per 24 hr tablet Take 1 tablet by mouth daily.    [provider]  naproxen (NAPROSYN) 500 MG tablet Take 1 tablet (500 mg total) by mouth 2 (two) times daily with a meal. 10/02/14   Szekalski, Kaitlyn, PA-C  ondansetron (ZOFRAN ODT) 8 MG disintegrating tablet Take 1 tablet (8 mg total) by mouth every 8 (eight) hours as needed for nausea or vomiting. 06/23/14   Charlann Lange, PA-C  oxyCODONE-acetaminophen (ROXICET) 5-325 MG/5ML solution Take 5 mLs by mouth every 3 (three) hours as needed (q6h). Patient not taking: Reported on 06/22/2014 06/15/12   Newton Pigg, MD  PARoxetine (PAXIL) 40 MG tablet Take 40  mg by mouth daily. 05/13/14   [provider]  prenatal vitamin w/FE, FA (NATACHEW) 29-1 MG CHEW Chew 1 tablet by mouth daily at 12 noon. Patient not taking: Reported on 06/22/2014 06/15/12   Newton Pigg, MD  traMADol (ULTRAM) 50 MG tablet Take 1 tablet (50 mg total) by mouth every 6 (six) hours as needed. 06/23/14   Charlann Lange, PA-C  traZODone (DESYREL) 50 MG tablet Take 50-75 mg by mouth at bedtime. 05/13/14   [provider]  TRI-PREVIFEM 0.18/0.215/0.25 MG-35 MCG tablet Take 1 tablet by mouth daily. 05/02/14   [provider]    Family History Family History  Problem Relation Age of Onset  . Cancer Maternal Grandmother   . Diabetes Paternal Grandmother   . Hypertension Mother   . Hyperlipidemia Mother   . Thyroid disease Mother   . Hypertension Father   .  Hyperlipidemia Father   . Heart disease Father   . Diabetes Maternal Aunt   . Diabetes Maternal Uncle   . Diabetes Paternal Aunt   . Diabetes Paternal Uncle     Social History Social History   Tobacco Use  . Smoking status: Never Smoker  . Smokeless tobacco: Never Used  Substance Use Topics  . Alcohol use: No  . Drug use: No     Allergies   Oxycodone and Vicodin [hydrocodone-acetaminophen]   Review of Systems Review of Systems  Constitutional: Negative for chills and fever.  HENT: Positive for sore throat. Negative for congestion and trouble swallowing.   Respiratory: Negative.  Negative for cough.   Cardiovascular: Negative.   Gastrointestinal: Positive for nausea. Negative for abdominal pain and vomiting.  Musculoskeletal: Negative.   Skin: Negative.   Neurological: Negative.  Negative for headaches.     Physical Exam Updated Vital Signs BP 126/87 (BP Location: Right Arm)   Pulse 67   Temp 98.7 F (37.1 C) (Oral)   Resp 16   Ht 5\' 4"  (1.626 m)   Wt 49.9 kg   LMP 12/30/2017   SpO2 100%   BMI 18.88 kg/m   Physical Exam  Constitutional: She is oriented to person, place, and time. She appears well-developed and well-nourished.  HENT:  Head: Normocephalic.  Nose: No mucosal edema.  Mouth/Throat: Uvula is midline and mucous membranes are normal. Posterior oropharyngeal erythema present. No oropharyngeal exudate or posterior oropharyngeal edema. No tonsillar exudate.  Neck: Normal range of motion. Neck supple.  Cardiovascular: Normal rate and regular rhythm.  Pulmonary/Chest: Effort normal and breath sounds normal. She has no wheezes. She has no rales.  Abdominal: Soft. Bowel sounds are normal. There is no tenderness. There is no rebound and no guarding.  Musculoskeletal: Normal range of motion.  Neurological: She is alert and oriented to person, place, and time.  Skin: Skin is warm and dry. No rash noted.  Psychiatric: She has a normal mood and affect.      ED Treatments / Results  Labs (all labs ordered are listed, but only abnormal results are displayed) Labs Reviewed - No data to display  EKG None  Radiology No results found.  Procedures Procedures (including critical care time)  Medications Ordered in ED Medications - No data to display   Initial Impression / Assessment and Plan / ED Course  I have reviewed the triage vital signs and the nursing notes.  Pertinent labs & imaging results that were available during my care of the patient were reviewed by me and considered in my medical decision making (see chart  for details).     Patient to the ED with c/o sore throat and nausea. OTC measures without relief.  Exam is benign, no tonsillar swelling or exudates. She is very well appearing. Son in ED with URI symptoms as well. Suspect viral process.   Final Clinical Impressions(s) / ED Diagnoses   Final diagnoses:  None   1. Pharyngitis  ED Discharge Orders    None       Charlann Lange, PA-C 01/14/18 1499    Ripley Fraise, MD 01/14/18 [phone]

## 2018-04-02 ENCOUNTER — Other Ambulatory Visit: Payer: Self-pay | Admitting: Obstetrics and Gynecology

## 2018-04-02 DIAGNOSIS — Z1231 Encounter for screening mammogram for malignant neoplasm of breast: Secondary | ICD-10-CM

## 2018-04-27 ENCOUNTER — Ambulatory Visit
Admission: RE | Admit: 2018-04-27 | Discharge: 2018-04-27 | Disposition: A | Discharge status: Home / Self Care | Payer: 59 | Source: Ambulatory Visit | Attending: Obstetrics and Gynecology | Admitting: Obstetrics and Gynecology

## 2018-04-27 DIAGNOSIS — Z1231 Encounter for screening mammogram for malignant neoplasm of breast: Secondary | ICD-10-CM

## 2018-11-07 ENCOUNTER — Encounter (HOSPITAL_COMMUNITY): Payer: Self-pay | Admitting: Emergency Medicine

## 2018-11-07 ENCOUNTER — Ambulatory Visit (HOSPITAL_COMMUNITY)
Admission: EM | Admit: 2018-11-07 | Discharge: 2018-11-07 | Disposition: A | Discharge status: Home / Self Care | Payer: 59 | Attending: Family Medicine | Admitting: Family Medicine

## 2018-11-07 ENCOUNTER — Other Ambulatory Visit: Payer: Self-pay

## 2018-11-07 DIAGNOSIS — R197 Diarrhea, unspecified: Secondary | ICD-10-CM | POA: Diagnosis not present

## 2018-11-07 DIAGNOSIS — Z20822 Contact with and (suspected) exposure to covid-19: Secondary | ICD-10-CM

## 2018-11-07 DIAGNOSIS — R1110 Vomiting, unspecified: Secondary | ICD-10-CM

## 2018-11-07 DIAGNOSIS — R51 Headache: Secondary | ICD-10-CM | POA: Diagnosis not present

## 2018-11-07 DIAGNOSIS — Z79899 Other long term (current) drug therapy: Secondary | ICD-10-CM | POA: Diagnosis not present

## 2018-11-07 DIAGNOSIS — R112 Nausea with vomiting, unspecified: Secondary | ICD-10-CM | POA: Diagnosis present

## 2018-11-07 DIAGNOSIS — F329 Major depressive disorder, single episode, unspecified: Secondary | ICD-10-CM | POA: Insufficient documentation

## 2018-11-07 DIAGNOSIS — R6883 Chills (without fever): Secondary | ICD-10-CM | POA: Diagnosis not present

## 2018-11-07 DIAGNOSIS — Z20828 Contact with and (suspected) exposure to other viral communicable diseases: Secondary | ICD-10-CM | POA: Diagnosis not present

## 2018-11-07 MED ORDER — ONDANSETRON HCL 4 MG PO TABS: 4.0000 mg | ORAL_TABLET | Freq: Four times a day (QID) | ORAL | 0 refills | Status: DC

## 2018-11-07 NOTE — ED Provider Notes (Signed)
[place]    CSN: [id number] Arrival date & time: 11/07/18  1455      History   Chief Complaint Chief Complaint  Patient presents with  . Headache  . Chills  . Emesis  . Diarrhea    HPI Kelli Rivers is a 48 y.o. female.   Patient presents with a 3-day history of nausea, vomiting, diarrhea, headache, chills.  No emesis or diarrhea today but the headache and nausea continue.  Patient was sent home from work and told to come get a COVID test.  She denies fever, cough, shortness of breath, or other symptoms.  LMP: 10/14/2018.  The history is provided by the patient.    Past Medical History:  Diagnosis Date  . Depression   . Medical history non-contributory   . No pertinent past medical history     Patient Active Problem List   Diagnosis Date Noted  . Twin pregnancy delivered 06/14/2012  . Twin pregnancy, twins discordant 06/14/2012  . S/P primary low transverse C-section 06/14/2012    Past Surgical History:  Procedure Laterality Date  . CESAREAN SECTION N/A 06/11/2012   Procedure: CESAREAN SECTION;  Surgeon: Melina Schools, MD;  Location: [facility];  Service: Obstetrics;  Laterality: N/A;  Primary Cesarean Section Twins Baby "A" Boy @ 0009, Apgars 9/9,  Baby "B" Boy @ 0011,   Apgars 9/9  . HIATAL HERNIA REPAIR    . HIATAL HERNIA REPAIR    . WISDOM TOOTH EXTRACTION      OB History    Gravida  1   Para  1   Term      Preterm  1   AB      Living  2     SAB      TAB      Ectopic      Multiple  1   Live Births  2            Home Medications    Prior to Admission medications   Medication Sig Start Date End Date Taking? Authorizing Provider  levocetirizine (XYZAL) 5 MG tablet 1 TABLET BY MOUTH AT BEDTIME CXL RX FOR ZYRTEC. 05/23/18  Yes [provider]  PARoxetine (PAXIL) 40 MG tablet Take 40 mg by mouth daily. 05/13/14  Yes [provider]  Ferrous Sulfate 142 (45 FE) MG TBCR Take by mouth.    [provider]  flintstones complete (FLINTSTONES) 60 MG chewable tablet Chew 1 tablet by mouth daily.    [provider]  folic acid (FOLVITE) Q000111Q MCG tablet Take 400 mcg by mouth daily.    [provider]  guaiFENesin-codeine 100-10 MG/5ML syrup Take 5 mLs by mouth every 4 (four) hours as needed for cough. Patient not taking: Reported on 06/22/2014 03/27/14   Fransico Meadow, PA-C  ibuprofen (ADVIL,MOTRIN) 600 MG tablet Take 1 tablet (600 mg total) by mouth every 6 (six) hours as needed. 01/14/18   Charlann Lange, PA-C  loratadine-pseudoephedrine (CLARITIN-D 24-HOUR) 10-240 MG per 24 hr tablet Take 1 tablet by mouth daily.    [provider]  naproxen (NAPROSYN) 500 MG tablet Take 1 tablet (500 mg total) by mouth 2 (two) times daily with a meal. 10/02/14   Szekalski, Kaitlyn, PA-C  ondansetron (ZOFRAN ODT) 8 MG disintegrating tablet Take 1 tablet (8 mg total) by mouth every 8 (eight) hours as needed for nausea or vomiting. 06/23/14   Charlann Lange, PA-C  ondansetron (ZOFRAN) 4 MG tablet Take  1 tablet (4 mg total) by mouth every 6 (six) hours. 11/07/18   Sharion Balloon, NP  oxyCODONE-acetaminophen (ROXICET) 5-325 MG/5ML solution Take 5 mLs by mouth every 3 (three) hours as needed (q6h). Patient not taking: Reported on 06/22/2014 06/15/12   Newton Pigg, MD  prenatal vitamin w/FE, FA (NATACHEW) 29-1 MG CHEW Chew 1 tablet by mouth daily at 12 noon. Patient not taking: Reported on 06/22/2014 06/15/12   Newton Pigg, MD  traMADol (ULTRAM) 50 MG tablet Take 1 tablet (50 mg total) by mouth every 6 (six) hours as needed. 06/23/14   Charlann Lange, PA-C  traZODone (DESYREL) 50 MG tablet Take 50-75 mg by mouth at bedtime. 05/13/14   [provider]  TRI-PREVIFEM 0.18/0.215/0.25 MG-35 MCG tablet Take 1 tablet by mouth daily. 05/02/14   [provider]    Family History Family History  Problem Relation Age of Onset  . Cancer Maternal Grandmother   . Diabetes Paternal  Grandmother   . Hypertension Mother   . Hyperlipidemia Mother   . Thyroid disease Mother   . Hypertension Father   . Hyperlipidemia Father   . Heart disease Father   . Diabetes Maternal Aunt   . Diabetes Maternal Uncle   . Diabetes Paternal Aunt   . Diabetes Paternal Uncle   . Breast cancer Neg Hx     Social History Social History   Tobacco Use  . Smoking status: Never Smoker  . Smokeless tobacco: Never Used  Substance Use Topics  . Alcohol use: No  . Drug use: No     Allergies   Oxycodone and Vicodin [hydrocodone-acetaminophen]   Review of Systems Review of Systems  Constitutional: Positive for chills. Negative for fever.  HENT: Negative for ear pain and sore throat.   Eyes: Negative for pain and visual disturbance.  Respiratory: Negative for cough and shortness of breath.   Cardiovascular: Negative for chest pain and palpitations.  Gastrointestinal: Positive for diarrhea, nausea and vomiting. Negative for abdominal pain.  Genitourinary: Negative for dysuria and hematuria.  Musculoskeletal: Negative for arthralgias and back pain.  Skin: Negative for color change and rash.  Neurological: Positive for headaches. Negative for seizures and syncope.  All other systems reviewed and are negative.    Physical Exam Triage Vital Signs ED Triage Vitals  Enc Vitals Group     BP      Pulse      Resp      Temp      Temp src      SpO2      Weight      Height      Head Circumference      Peak Flow      Pain Score      Pain Loc      Pain Edu?      Excl. in [place]?    No data found.  Updated Vital Signs BP 137/85 (BP Location: Left Arm)   Pulse 71   Temp 98.6 F (37 C) (Oral)   Resp 16   LMP 10/14/2018   SpO2 100%   Visual Acuity Right Eye Distance:   Left Eye Distance:   Bilateral Distance:    Right Eye Near:   Left Eye Near:    Bilateral Near:     Physical Exam Vitals signs and nursing note reviewed.  Constitutional:      General: She is not in  acute distress.    Appearance: She is well-developed.  HENT:  Head: Normocephalic and atraumatic.     Right Ear: Tympanic membrane normal.     Left Ear: Tympanic membrane normal.     Nose: Nose normal.     Mouth/Throat:     Mouth: Mucous membranes are moist.     Pharynx: Oropharynx is clear.  Eyes:     Conjunctiva/sclera: Conjunctivae normal.  Neck:     Musculoskeletal: Neck supple.  Cardiovascular:     Rate and Rhythm: Normal rate and regular rhythm.     Heart sounds: No murmur.  Pulmonary:     Effort: Pulmonary effort is normal. No respiratory distress.     Breath sounds: Normal breath sounds. No wheezing or rhonchi.  Abdominal:     General: Bowel sounds are normal.     Palpations: Abdomen is soft.     Tenderness: There is no abdominal tenderness. There is no right CVA tenderness, left CVA tenderness, guarding or rebound.  Skin:    General: Skin is warm and dry.     Findings: No rash.  Neurological:     Mental Status: She is alert.      UC Treatments / Results  Labs (all labs ordered are listed, but only abnormal results are displayed) Labs Reviewed  NOVEL CORONAVIRUS, NAA (HOSP ORDER, SEND-OUT TO REF LAB; TAT 18-24 HRS)    EKG   Radiology No results found.  Procedures Procedures (including critical care time)  Medications Ordered in UC Medications - No data to display  Initial Impression / Assessment and Plan / UC Course  I have reviewed the triage vital signs and the nursing notes.  Pertinent labs & imaging results that were available during my care of the patient were reviewed by me and considered in my medical decision making (see chart for details).    Non-intractable vomiting with nausea.  Suspected COVID.  Treating with Zofran as needed.  COVID test performed here.  Instructed patient to self quarantine until her test results are back.  Instructed patient to go to the emergency department if she develops high fever, shortness of breath, severe  diarrhea, or other concerning symptoms.  Patient agrees with plan of care.     Final Clinical Impressions(s) / UC Diagnoses   Final diagnoses:  Non-intractable vomiting with nausea, unspecified vomiting type  Suspected Covid-19 Virus Infection     Discharge Instructions     Take the antinausea medicine Zofran as prescribed.    Your COVID test is pending.  You should self quarantine until your test result is back and is negative.    Go to the emergency department if you develop shortness of breath, high fever, severe diarrhea, or other concerning symptoms.       ED Prescriptions    Medication Sig Dispense Auth. Provider   ondansetron (ZOFRAN) 4 MG tablet Take 1 tablet (4 mg total) by mouth every 6 (six) hours. 12 tablet Sharion Balloon, NP     Controlled Substance Prescriptions [place] Controlled Substance Registry consulted? Not Applicable   Sharion Balloon, NP 11/07/18 [phone]

## 2018-11-07 NOTE — Discharge Instructions (Signed)
Take the antinausea medicine Zofran as prescribed.    Your COVID test is pending.  You should self quarantine until your test result is back and is negative.    Go to the emergency department if you develop shortness of breath, high fever, severe diarrhea, or other concerning symptoms.

## 2018-11-07 NOTE — ED Triage Notes (Signed)
Pt reports diarrhea and nausea that started on Tuesday.  Thursday she developed vomiting and today a headache.  Pt denies a fever, but she has been having chills.  She denies body aches or loss of taste or smell.

## 2018-11-08 LAB — NOVEL CORONAVIRUS, NAA (HOSP ORDER, SEND-OUT TO REF LAB; TAT 18-24 HRS): SARS-CoV-2, NAA: NOT DETECTED

## 2018-11-09 ENCOUNTER — Encounter (HOSPITAL_COMMUNITY): Payer: Self-pay

## 2019-04-15 ENCOUNTER — Ambulatory Visit: Payer: 59

## 2019-04-19 ENCOUNTER — Ambulatory Visit: Payer: 59 | Attending: Family

## 2019-04-19 DIAGNOSIS — Z23 Encounter for immunization: Secondary | ICD-10-CM | POA: Insufficient documentation

## 2019-04-19 NOTE — Progress Notes (Signed)
   Covid-19 Vaccination Clinic  Name:  Kelli Rivers    MRN: [id number] DOB: [birth year]  04/19/2019  Kelli Rivers was observed post Covid-19 immunization for 15 minutes without incidence. She was provided with Vaccine Information Sheet and instruction to access the V-Safe system.   Kelli Rivers was instructed to call 911 with any severe reactions post vaccine: Marland Kitchen Difficulty breathing  . Swelling of your face and throat  . A fast heartbeat  . A bad rash all over your body  . Dizziness and weakness    Immunizations Administered    Name Date Dose VIS Date Route   Moderna COVID-19 Vaccine 04/19/2019 11:46 AM 0.5 mL 01/26/2019 Intramuscular   Manufacturer: Moderna   Lot: [id number]   [id number]

## 2019-05-06 ENCOUNTER — Other Ambulatory Visit: Payer: Self-pay | Admitting: Family

## 2019-05-06 DIAGNOSIS — Z1231 Encounter for screening mammogram for malignant neoplasm of breast: Secondary | ICD-10-CM

## 2019-05-18 ENCOUNTER — Ambulatory Visit: Payer: 59 | Attending: Family

## 2019-05-18 DIAGNOSIS — Z23 Encounter for immunization: Secondary | ICD-10-CM

## 2019-05-18 NOTE — Progress Notes (Signed)
   Covid-19 Vaccination Clinic  Name:  Kelli Rivers    MRN: [id number] DOB: March 22, 1970  05/18/2019  Ms. [name] was observed post Covid-19 immunization for 15 minutes without incident. She was provided with Vaccine Information Sheet and instruction to access the V-Safe system.   [name] was instructed to call 911 with any severe reactions post vaccine: Marland Kitchen Difficulty breathing  . Swelling of face and throat  . A fast heartbeat  . A bad rash all over body  . Dizziness and weakness   Immunizations Administered    Name Date Dose VIS Date Route   Moderna COVID-19 Vaccine 05/18/2019  2:50 PM 0.5 mL 01/26/2019 Intramuscular   Manufacturer: Moderna   LotMV:[id number]   [place]BE:[id number]

## 2019-05-24 ENCOUNTER — Ambulatory Visit: Payer: 59

## 2019-06-29 ENCOUNTER — Ambulatory Visit
Admission: RE | Admit: 2019-06-29 | Discharge: 2019-06-29 | Disposition: A | Discharge status: Home / Self Care | Payer: 59 | Source: Ambulatory Visit | Attending: Family | Admitting: Family

## 2019-06-29 ENCOUNTER — Other Ambulatory Visit: Payer: Self-pay

## 2019-06-29 DIAGNOSIS — Z1231 Encounter for screening mammogram for malignant neoplasm of breast: Secondary | ICD-10-CM

## 2020-06-27 ENCOUNTER — Other Ambulatory Visit: Payer: Self-pay | Admitting: Obstetrics and Gynecology

## 2020-07-31 ENCOUNTER — Ambulatory Visit (HOSPITAL_COMMUNITY)
Admission: EM | Admit: 2020-07-31 | Discharge: 2020-07-31 | Disposition: A | Discharge status: Home / Self Care | Payer: No Typology Code available for payment source | Attending: Urgent Care | Admitting: Urgent Care

## 2020-07-31 ENCOUNTER — Encounter (HOSPITAL_COMMUNITY): Payer: Self-pay | Admitting: Emergency Medicine

## 2020-07-31 DIAGNOSIS — J019 Acute sinusitis, unspecified: Secondary | ICD-10-CM | POA: Diagnosis not present

## 2020-07-31 DIAGNOSIS — U071 COVID-19: Secondary | ICD-10-CM | POA: Diagnosis not present

## 2020-07-31 MED ORDER — AMOXICILLIN 875 MG PO TABS: 875.0000 mg | ORAL_TABLET | Freq: Two times a day (BID) | ORAL | 0 refills | Status: DC

## 2020-07-31 MED ORDER — PSEUDOEPHEDRINE HCL 60 MG PO TABS: 60.0000 mg | ORAL_TABLET | Freq: Three times a day (TID) | ORAL | 0 refills | Status: DC | PRN

## 2020-07-31 NOTE — ED Provider Notes (Signed)
[place]   MRN: [id number] DOB: Apr 08, 1970  Subjective:   Kelli Rivers is a 50 y.o. female presenting for 8-day history of acute onset persistent and worsening sinus congestion, postnasal drainage, throat pain that is eliciting a cough worse at night.  She did have an episode of vomiting from this as well.  She tested positive for COVID-19 this past Saturday.  Has been using levocetirizine with minimal relief.  She does have a history of significant allergies.  Denies chest pain, shortness of breath.  No history of asthma.  She is not a smoker.  No alcohol use or drug use.  No current facility-administered medications for this encounter.  Current Outpatient Medications:  .  Ferrous Sulfate 142 (45 FE) MG TBCR, Take by mouth., Disp: , Rfl:  .  flintstones complete (FLINTSTONES) 60 MG chewable tablet, Chew 1 tablet by mouth daily., Disp: , Rfl:  .  folic acid (FOLVITE) 409 MCG tablet, Take 400 mcg by mouth daily., Disp: , Rfl:  .  guaiFENesin-codeine 100-10 MG/5ML syrup, Take 5 mLs by mouth every 4 (four) hours as needed for cough. (Patient not taking: No sig reported), Disp: 120 mL, Rfl: 0 .  ibuprofen (ADVIL,MOTRIN) 600 MG tablet, Take 1 tablet (600 mg total) by mouth every 6 (six) hours as needed., Disp: 30 tablet, Rfl: 0 .  levocetirizine (XYZAL) 5 MG tablet, 1 TABLET BY MOUTH AT BEDTIME CXL RX FOR ZYRTEC., Disp: , Rfl:  .  loratadine-pseudoephedrine (CLARITIN-D 24-HOUR) 10-240 MG per 24 hr tablet, Take 1 tablet by mouth daily., Disp: , Rfl:  .  naproxen (NAPROSYN) 500 MG tablet, Take 1 tablet (500 mg total) by mouth 2 (two) times daily with a meal., Disp: 30 tablet, Rfl: 0 .  ondansetron (ZOFRAN ODT) 8 MG disintegrating tablet, Take 1 tablet (8 mg total) by mouth every 8 (eight) hours as needed for nausea or vomiting., Disp: 20 tablet, Rfl: 0 .  ondansetron (ZOFRAN) 4 MG tablet, Take 1 tablet (4 mg total) by mouth every 6 (six) hours., Disp: 12 tablet, Rfl: 0 .   oxyCODONE-acetaminophen (ROXICET) 5-325 MG/5ML solution, Take 5 mLs by mouth every 3 (three) hours as needed (q6h). (Patient not taking: No sig reported), Disp: 250 mL, Rfl: 0 .  PARoxetine (PAXIL) 40 MG tablet, Take 40 mg by mouth daily., Disp: , Rfl: 2 .  prenatal vitamin w/FE, FA (NATACHEW) 29-1 MG CHEW, Chew 1 tablet by mouth daily at 12 noon. (Patient not taking: Reported on 06/22/2014), Disp: 30 tablet, Rfl: 6 .  traMADol (ULTRAM) 50 MG tablet, Take 1 tablet (50 mg total) by mouth every 6 (six) hours as needed., Disp: 15 tablet, Rfl: 0 .  traZODone (DESYREL) 50 MG tablet, Take 50-75 mg by mouth at bedtime., Disp: , Rfl: 1 .  TRI-PREVIFEM 0.18/0.215/0.25 MG-35 MCG tablet, Take 1 tablet by mouth daily., Disp: , Rfl: 0   Allergies  Allergen Reactions  . Oxycodone Itching  . Vicodin [Hydrocodone-Acetaminophen] Itching and Nausea Only    Past Medical History:  Diagnosis Date  . Depression   . Medical history non-contributory   . No pertinent past medical history      Past Surgical History:  Procedure Laterality Date  . CESAREAN SECTION N/A 06/11/2012   Procedure: CESAREAN SECTION;  Surgeon: Melina Schools, MD;  Location: [facility];  Service: Obstetrics;  Laterality: N/A;  Primary Cesarean Section Twins Baby "A" Boy @ 0009, Apgars 9/9,  Baby "B" Boy @ 0011,  Apgars 9/9  . HIATAL HERNIA REPAIR    . HIATAL HERNIA REPAIR    . WISDOM TOOTH EXTRACTION      Family History  Problem Relation Age of Onset  . Cancer Maternal Grandmother   . Diabetes Paternal Grandmother   . Hypertension Mother   . Hyperlipidemia Mother   . Thyroid disease Mother   . Hypertension Father   . Hyperlipidemia Father   . Heart disease Father   . Diabetes Maternal Aunt   . Diabetes Maternal Uncle   . Diabetes Paternal Aunt   . Diabetes Paternal Uncle   . Breast cancer Neg Hx     Social History   Tobacco Use  . Smoking status: Never Smoker  . Smokeless tobacco: Never Used  Substance Use Topics  .  Alcohol use: No  . Drug use: No    ROS   Objective:   Vitals: BP (!) 134/94   Pulse 92   Temp 98.1 F (36.7 C)   Resp 18   LMP 07/28/2020   SpO2 99%   Breastfeeding No   Physical Exam Constitutional:      General: She is not in acute distress.    Appearance: Normal appearance. She is well-developed. She is not ill-appearing, toxic-appearing or diaphoretic.  HENT:     Head: Normocephalic and atraumatic.     Nose: Congestion and rhinorrhea present.     Comments: Frontal sinus tenderness.    Mouth/Throat:     Mouth: Mucous membranes are moist.  Eyes:     Extraocular Movements: Extraocular movements intact.     Pupils: Pupils are equal, round, and reactive to light.  Cardiovascular:     Rate and Rhythm: Normal rate and regular rhythm.     Pulses: Normal pulses.     Heart sounds: Normal heart sounds. No murmur heard. No friction rub. No gallop.   Pulmonary:     Effort: Pulmonary effort is normal. No respiratory distress.     Breath sounds: Normal breath sounds. No stridor. No wheezing, rhonchi or rales.  Skin:    General: Skin is warm and dry.     Findings: No rash.  Neurological:     Mental Status: She is alert and oriented to person, place, and time.  Psychiatric:        Mood and Affect: Mood normal.        Behavior: Behavior normal.        Thought Content: Thought content normal.        Judgment: Judgment normal.      Assessment and Plan :   PDMP not reviewed this encounter.  1. Acute non-recurrent sinusitis, unspecified location   2. COVID-19     Will start empiric treatment for sinusitis with amoxicillin.  Recommended supportive care otherwise including the use of oral antihistamine, decongestant. Counseled patient on potential for adverse effects with medications prescribed/recommended today, ER and return-to-clinic precautions discussed, patient verbalized understanding.    Jaynee Eagles, PA-C 07/31/20 1409

## 2020-07-31 NOTE — ED Triage Notes (Signed)
Pt is present today with a cough, vomiting, nausea, and HA. Pt states that she tested positive for Covid Saturday.

## 2021-03-10 IMAGING — MG DIGITAL SCREENING BILAT W/ CAD
4 series · 4 of 4 positions shown · non-contrast
Comparison: Previous exam(s).

CLINICAL DATA: Screening.

EXAM:
DIGITAL SCREENING BILATERAL MAMMOGRAM WITH CAD

[L CC]
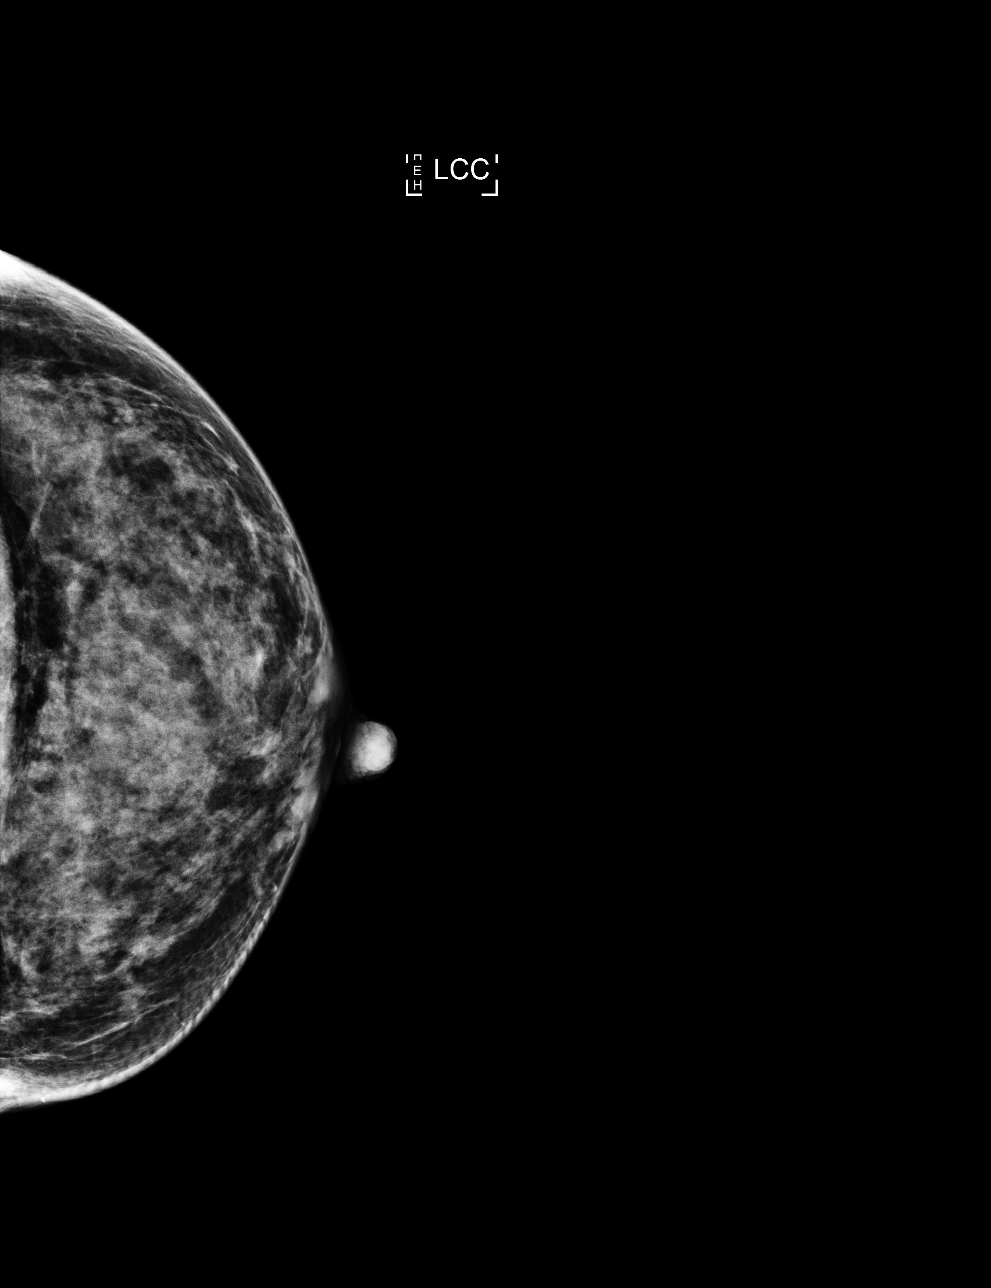

[R CC]
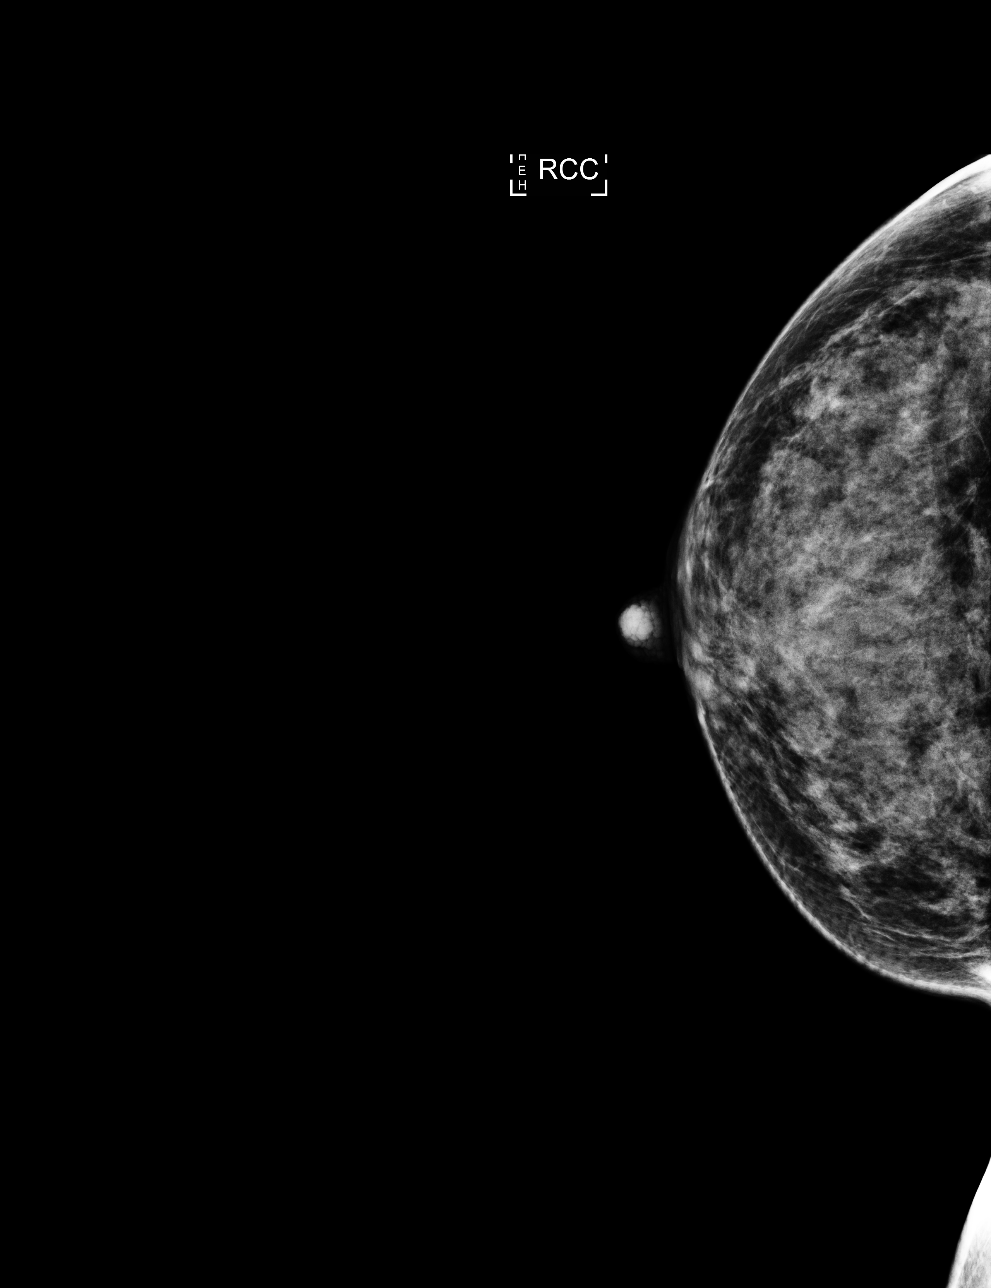

[R MLO]
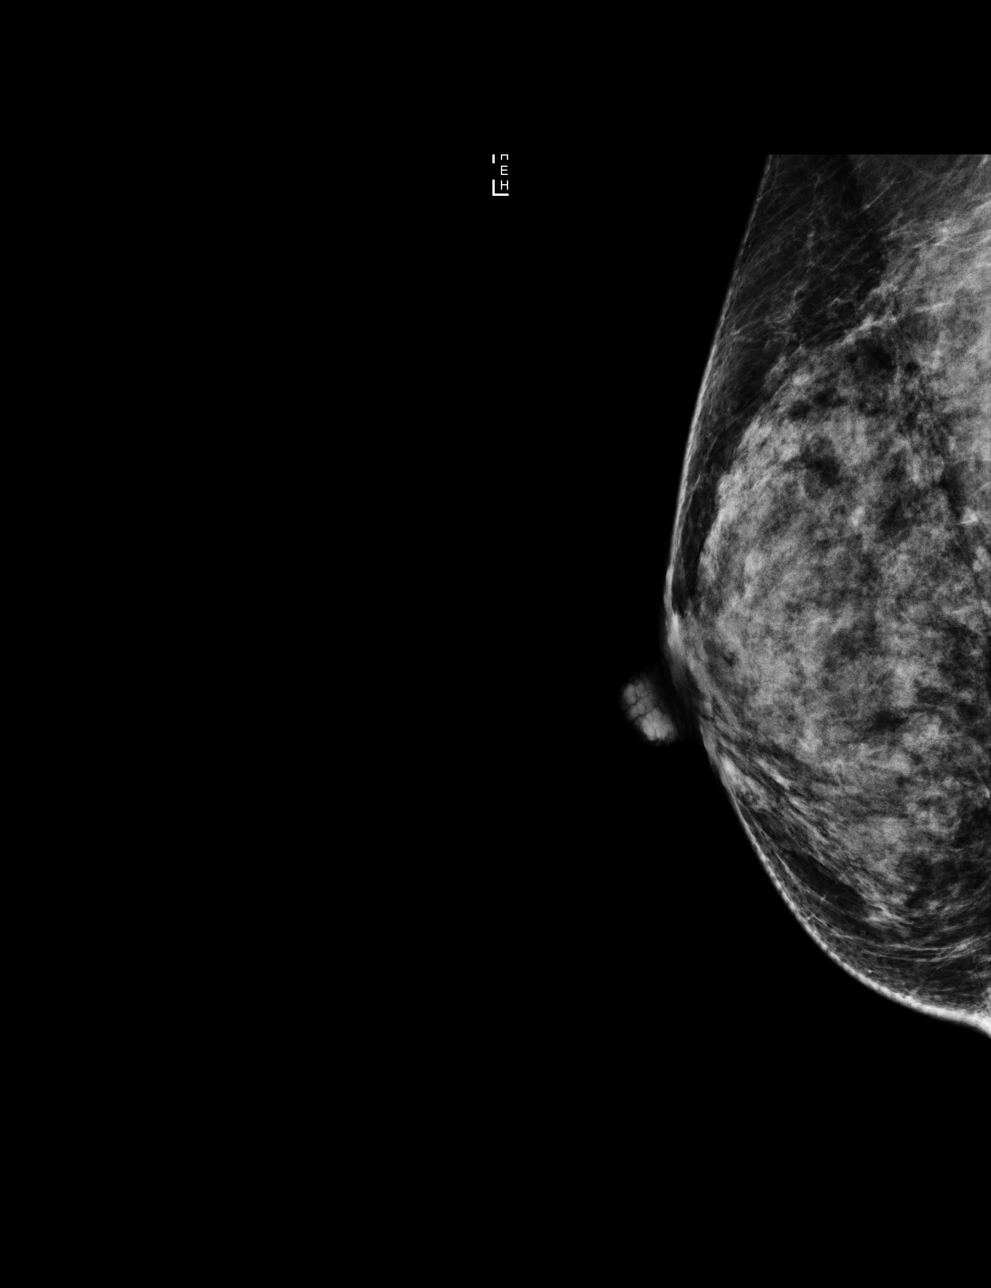

[L MLO]
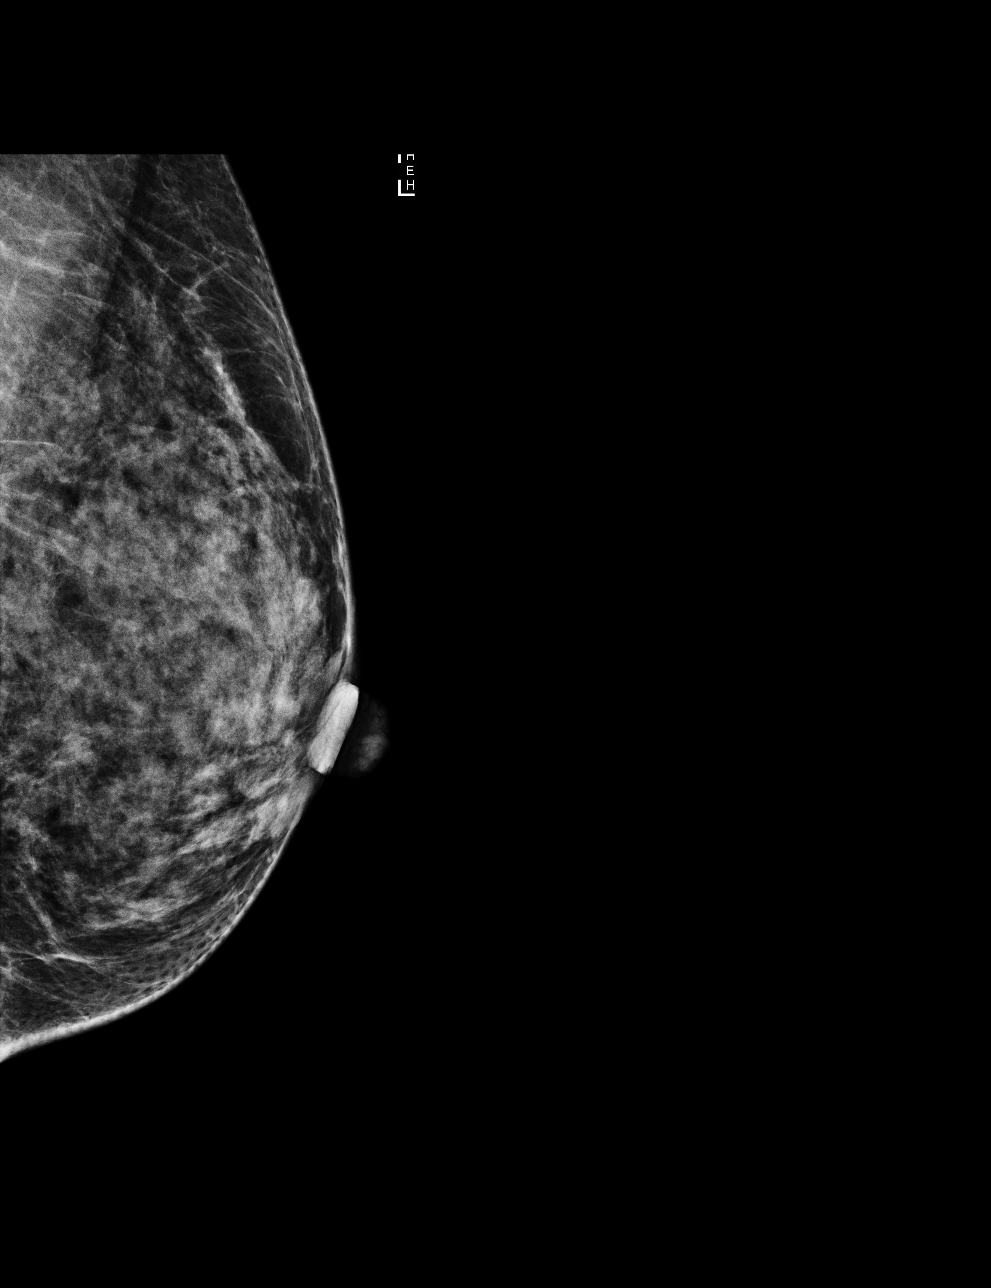

[4 of 4 positions shown; findings below may reference images not displayed]

ACR Breast Density Category d: The breast tissue is extremely dense,
which lowers the sensitivity of mammography
FINDINGS: There are no findings suspicious for malignancy. Images were
processed with CAD.
IMPRESSION: No mammographic evidence of malignancy. A result letter of this
screening mammogram will be mailed directly to the patient.

RECOMMENDATION:
Screening mammogram in one year. (Code:A5-2-HPS)

BI-RADS CATEGORY  1: Negative.

## 2022-02-11 ENCOUNTER — Encounter: Payer: Self-pay | Admitting: Internal Medicine

## 2022-03-25 ENCOUNTER — Ambulatory Visit (AMBULATORY_SURGERY_CENTER): Payer: No Typology Code available for payment source

## 2022-03-25 DIAGNOSIS — Z1211 Encounter for screening for malignant neoplasm of colon: Secondary | ICD-10-CM

## 2022-03-25 MED ORDER — NA SULFATE-K SULFATE-MG SULF 17.5-3.13-1.6 GM/177ML PO SOLN: 1.0000 | Freq: Once | ORAL | 0 refills | Status: AC

## 2022-03-25 NOTE — Progress Notes (Signed)
No egg allergy known to patient.  Patient with soy allergy reaction: rash and itching.  No issues known to pt with past sedation with any surgeries or procedures Patient denies ever being told they had issues or difficulty with intubation  No FH of Malignant Hyperthermia Pt is not on diet pills Pt is not on  home 02  Pt is not on blood thinners  Pt denies issues with constipation  No A fib or A flutter Have any cardiac testing pending--no  Pt instructed to use Singlecare.com or GoodRx for a price reduction on prep   Patient's chart reviewed by Osvaldo Angst CNRA prior to previsit and patient appropriate for the [place].  Previsit completed and red dot placed by patient's name on their procedure day (on provider's schedule).

## 2022-04-03 ENCOUNTER — Encounter: Payer: Self-pay | Admitting: Internal Medicine

## 2022-04-09 ENCOUNTER — Encounter: Payer: Self-pay | Admitting: Certified Registered Nurse Anesthetist

## 2022-04-15 ENCOUNTER — Encounter: Payer: Self-pay | Admitting: Internal Medicine

## 2022-04-15 ENCOUNTER — Ambulatory Visit: Payer: No Typology Code available for payment source | Admitting: Internal Medicine

## 2022-04-15 VITALS — BP 122/75 | HR 86 | Temp 97.5°F | Resp 15 | Ht 64.0 in | Wt 102.2 lb

## 2022-04-15 DIAGNOSIS — D122 Benign neoplasm of ascending colon: Secondary | ICD-10-CM

## 2022-04-15 DIAGNOSIS — Z1211 Encounter for screening for malignant neoplasm of colon: Secondary | ICD-10-CM | POA: Diagnosis not present

## 2022-04-15 MED ORDER — SODIUM CHLORIDE 0.9 % IV SOLN: 500.0000 mL | Freq: Once | INTRAVENOUS | Status: DC

## 2022-04-15 NOTE — Op Note (Signed)
[facility] Patient Name: Kelli Rivers Procedure Date: 04/15/2022 11:13 AM MRN: [id number] Endoscopist: Georgian Co , , [id number] Age: 52 Referring MD:  Date of Birth: 04-15-1970 Gender: Female Account #: [id number] Procedure:                Colonoscopy Indications:              Screening for colorectal malignant neoplasm, This                            is the patient's first colonoscopy Medicines:                Monitored Anesthesia Care Procedure:                Pre-Anesthesia Assessment:                           - Prior to the procedure, a History and Physical                            was performed, and patient medications and                            allergies were reviewed. The patient's tolerance of                            previous anesthesia was also reviewed. The risks                            and benefits of the procedure and the sedation                            options and risks were discussed with the patient.                            All questions were answered, and informed consent                            was obtained. Prior Anticoagulants: The patient has                            taken no anticoagulant or antiplatelet agents. ASA                            Grade Assessment: II - A patient with mild systemic                            disease. After reviewing the risks and benefits,                            the patient was deemed in satisfactory condition to                            undergo the procedure.  After obtaining informed consent, the colonoscope                            was passed under direct vision. Throughout the                            procedure, the patient's blood pressure, pulse, and                            oxygen saturations were monitored continuously. The                            Olympus [id number]) Colonoscope was                            introduced through  the anus and advanced to the the                            terminal ileum. The colonoscopy was performed                            without difficulty. The patient tolerated the                            procedure well. The quality of the bowel                            preparation was good. The terminal ileum, ileocecal                            valve, appendiceal orifice, and rectum were                            photographed. Scope In: 11:17:47 AM Scope Out: 11:38:13 AM Scope Withdrawal Time: 0 hours 10 minutes 17 seconds  Total Procedure Duration: 0 hours 20 minutes 26 seconds  Findings:                 The terminal ileum appeared normal.                           A 4 mm polyp was found in the ascending colon. The                            polyp was sessile. The polyp was removed with a                            cold snare. Resection and retrieval were complete.                           Non-bleeding internal hemorrhoids were found during                            retroflexion. Complications:  No immediate complications. Estimated Blood Loss:     Estimated blood loss was minimal. Impression:               - The examined portion of the ileum was normal.                           - One 4 mm polyp in the ascending colon, removed                            with a cold snare. Resected and retrieved.                           - Non-bleeding internal hemorrhoids. Recommendation:           - Discharge patient to home (with escort).                           - Await pathology results.                           - The findings and recommendations were discussed                            with the patient. Dr Georgian Co "Lyndee Leo" [place],  04/15/2022 11:41:09 AM

## 2022-04-15 NOTE — Patient Instructions (Signed)
Please read handouts provided. Await pathology results.   YOU HAD AN ENDOSCOPIC PROCEDURE TODAY AT [facility]:   Refer to the procedure report that was given to you for any specific questions about what was found during the examination.  If the procedure report does not answer your questions, please call your gastroenterologist to clarify.  If you requested that your care partner not be given the details of your procedure findings, then the procedure report has been included in a sealed envelope for you to review at your convenience later.  YOU SHOULD EXPECT: Some feelings of bloating in the abdomen. Passage of more gas than usual.  Walking can help get rid of the air that was put into your GI tract during the procedure and reduce the bloating. If you had a lower endoscopy (such as a colonoscopy or flexible sigmoidoscopy) you may notice spotting of blood in your stool or on the toilet paper. If you underwent a bowel prep for your procedure, you may not have a normal bowel movement for a few days.  Please Note:  You might notice some irritation and congestion in your nose or some drainage.  This is from the oxygen used during your procedure.  There is no need for concern and it should clear up in a day or so.  SYMPTOMS TO REPORT IMMEDIATELY:  Following lower endoscopy (colonoscopy or flexible sigmoidoscopy):  Excessive amounts of blood in the stool  Significant tenderness or worsening of abdominal pains  Swelling of the abdomen that is new, acute  Fever of 100F or higher  For urgent or emergent issues, a gastroenterologist can be reached at any hour by calling [phone]. Do not use MyChart messaging for urgent concerns.    DIET:  We do recommend a small meal at first, but then you may proceed to your regular diet.  Drink plenty of fluids but you should avoid alcoholic beverages for 24 hours.  ACTIVITY:  You should plan to take it easy for the rest of today and you  should NOT DRIVE or use heavy machinery until tomorrow (because of the sedation medicines used during the test).    FOLLOW UP: Our staff will call the number listed on your records the next business day following your procedure.  We will call around 7:15- 8:00 am to check on you and address any questions or concerns that you may have regarding the information given to you following your procedure. If we do not reach you, we will leave a message.     If any biopsies were taken you will be contacted by phone or by letter within the next 1-3 weeks.  Please call us at [phone] if you have not heard about the biopsies in 3 weeks.    SIGNATURES/CONFIDENTIALITY: You and/or your care partner have signed paperwork which will be entered into your electronic medical record.  These signatures attest to the fact that that the information above on your After Visit Summary has been reviewed and is understood.  Full responsibility of the confidentiality of this discharge information lies with you and/or your care-partner.

## 2022-04-15 NOTE — Progress Notes (Signed)
VS by DT  Pt's states no medical or surgical changes since previsit or office visit.  

## 2022-04-15 NOTE — Progress Notes (Signed)
Report given to PACU, vss 

## 2022-04-15 NOTE — Progress Notes (Signed)
Called to room to assist during endoscopic procedure.  Patient ID and intended procedure confirmed with present staff. Received instructions for my participation in the procedure from the performing physician.  

## 2022-04-15 NOTE — Progress Notes (Signed)
GASTROENTEROLOGY PROCEDURE H&P NOTE   Primary Care Physician: Vonna Drafts, FNP    Reason for Procedure:   Colon cancer screening  Plan:    Colonoscopy  Patient is appropriate for endoscopic procedure(s) in the ambulatory ([facility]) setting.  The nature of the procedure, as well as the risks, benefits, and alternatives were carefully and thoroughly reviewed with the patient. Ample time for discussion and questions allowed. The patient understood, was satisfied, and agreed to proceed.     HPI: Kelli Rivers is a 52 y.o. female who presents for colonoscopy for colon cancer screening. Denies blood in stools, changes in bowel habits, or unintentional weight loss. Maternal aunt and grandmother had colon cancer.  Past Medical History:  Diagnosis Date   Allergy    Anxiety    Depression    Medical history non-contributory    No pertinent past medical history     Past Surgical History:  Procedure Laterality Date   CESAREAN SECTION N/A 06/11/2012   Procedure: CESAREAN SECTION;  Surgeon: Melina Schools, MD;  Location: [facility];  Service: Obstetrics;  Laterality: N/A;  Primary Cesarean Section Twins Baby "A" Boy @ 0009, Apgars 9/9,  Baby "B" Boy @ 0011,   Apgars 9/9   HIATAL HERNIA REPAIR     HIATAL HERNIA REPAIR     WISDOM TOOTH EXTRACTION      Prior to Admission medications   Medication Sig Start Date End Date Taking? Authorizing Provider  Cholecalciferol (VITAMIN D-3 PO) Take 1 tablet by mouth 2 (two) times a week.   Yes [provider]  levocetirizine (XYZAL) 5 MG tablet 1 TABLET BY MOUTH AT BEDTIME CXL RX FOR ZYRTEC. 05/23/18  Yes [provider]  sertraline (ZOLOFT) 50 MG tablet Take 50 mg by mouth daily. 11/23/21  Yes [provider]    Current Outpatient Medications  Medication Sig Dispense Refill   Cholecalciferol (VITAMIN D-3 PO) Take 1 tablet by mouth 2 (two) times a week.     levocetirizine (XYZAL) 5 MG tablet 1 TABLET BY MOUTH AT  BEDTIME CXL RX FOR ZYRTEC.     sertraline (ZOLOFT) 50 MG tablet Take 50 mg by mouth daily.     No current facility-administered medications for this visit.    Allergies as of 04/15/2022 - Review Complete 04/15/2022  Allergen Reaction Noted   Milk (cow) Hives 03/25/2022   Oxycodone Itching 10/02/2014   Vicodin [hydrocodone-acetaminophen] Itching and Nausea Only 06/11/2012   Soy allergy Rash 03/25/2022    Family History  Problem Relation Age of Onset   Hypertension Mother    Hyperlipidemia Mother    Thyroid disease Mother    Hypertension Father    Hyperlipidemia Father    Heart disease Father    Colon cancer Maternal Aunt        Not sure of onset may have passed in her 53 from colon cancer not sure of age   Diabetes Maternal Aunt    Diabetes Maternal Uncle    Diabetes Paternal Aunt    Diabetes Paternal Uncle    Cancer Maternal Grandmother    Diabetes Paternal Grandmother    Breast cancer Neg Hx    Colon polyps Neg Hx    Esophageal cancer Neg Hx    Rectal cancer Neg Hx    Stomach cancer Neg Hx     Social History   Socioeconomic History   Marital status: Married    Spouse name: Not on file   Number of children: Not on  file   Years of education: Not on file   Highest education level: Not on file  Occupational History   Not on file  Tobacco Use   Smoking status: Never   Smokeless tobacco: Never  Vaping Use   Vaping Use: Never used  Substance and Sexual Activity   Alcohol use: No   Drug use: No   Sexual activity: Not Currently  Other Topics Concern   Not on file  Social History Narrative   Not on file   Social Determinants of Health   Financial Resource Strain: Not on file  Food Insecurity: Not on file  Transportation Needs: Not on file  Physical Activity: Not on file  Stress: Not on file  Social Connections: Not on file  Intimate Partner Violence: Not on file    Physical Exam: Vital signs in last 24 hours: BP 128/78   Pulse 70   Temp (!) 97.5 F  (36.4 C)   Ht 5' 4"$  (1.626 m)   Wt 102 lb 3.2 oz (46.4 kg)   LMP 04/04/2022   SpO2 100%   BMI 17.54 kg/m  GEN: NAD EYE: Sclerae anicteric ENT: MMM CV: Non-tachycardic Pulm: No increased work of breathing GI: Soft, NT/ND NEURO:  Alert & Oriented   Christia Reading, MD [facility]  04/15/2022 11:05 AM

## 2022-04-16 ENCOUNTER — Telehealth: Payer: Self-pay

## 2022-04-16 NOTE — Telephone Encounter (Signed)
  Follow up Call-     04/15/2022   11:00 AM  Call back number  Post procedure Call Back phone  # [phone]  Permission to leave phone message Yes     Patient questions:  Do you have a fever, pain , or abdominal swelling? No. Pain Score  0 *  Have you tolerated food without any problems? Yes.    Have you been able to return to your normal activities? Yes.    Do you have any questions about your discharge instructions: Diet   No. Medications  No. Follow up visit  No.  Do you have questions or concerns about your Care? No.  Actions: * If pain score is 4 or above: No action needed, pain <4.

## 2022-04-17 ENCOUNTER — Encounter: Payer: Self-pay | Admitting: Internal Medicine

## 2023-01-08 ENCOUNTER — Encounter (HOSPITAL_BASED_OUTPATIENT_CLINIC_OR_DEPARTMENT_OTHER): Payer: Self-pay | Admitting: Emergency Medicine

## 2023-01-08 ENCOUNTER — Emergency Department (HOSPITAL_BASED_OUTPATIENT_CLINIC_OR_DEPARTMENT_OTHER)
Admission: EM | Admit: 2023-01-08 | Discharge: 2023-01-08 | Disposition: A | Discharge status: Home / Self Care | Payer: No Typology Code available for payment source | Attending: Emergency Medicine | Admitting: Emergency Medicine

## 2023-01-08 ENCOUNTER — Other Ambulatory Visit: Payer: Self-pay

## 2023-01-08 DIAGNOSIS — J069 Acute upper respiratory infection, unspecified: Secondary | ICD-10-CM | POA: Insufficient documentation

## 2023-01-08 DIAGNOSIS — Z1152 Encounter for screening for COVID-19: Secondary | ICD-10-CM | POA: Insufficient documentation

## 2023-01-08 DIAGNOSIS — J029 Acute pharyngitis, unspecified: Secondary | ICD-10-CM | POA: Diagnosis present

## 2023-01-08 LAB — RESP PANEL BY RT-PCR (RSV, FLU A&B, COVID)  RVPGX2
Influenza A by PCR: NEGATIVE
Influenza B by PCR: NEGATIVE
Resp Syncytial Virus by PCR: NEGATIVE
SARS Coronavirus 2 by RT PCR: NEGATIVE

## 2023-01-08 LAB — GROUP A STREP BY PCR: Group A Strep by PCR: NOT DETECTED

## 2023-01-08 NOTE — Discharge Instructions (Signed)
Your viral panel is negative for COVID, RSV, influenza and your strep test is negative as well.   Treat any aches with tylenol and/or ibuprofen. Push fluids. You can take antihistamines for symptomatic treatment of post-nasal drip and any congestion.   Return to the ED with any worsening symptoms at any time.

## 2023-01-08 NOTE — ED Provider Notes (Signed)
[place] EMERGENCY DEPARTMENT AT [facility] Provider Note   CSN: [id number] Arrival date & time: 01/08/23  1910     History  Chief Complaint  Patient presents with   Otalgia   Sore Throat   Cough    Kelli Rivers is a 52 y.o. female.  Patient with symptoms for 3 weeks of sore throat with post-nasal drainage, cough and right ear pain. No fever. Multiple family member with similar symptoms.   The history is provided by the patient. No language interpreter was used.  Otalgia Associated symptoms: cough   Sore Throat  Cough Associated symptoms: ear pain        Home Medications Prior to Admission medications   Medication Sig Start Date End Date Taking? Authorizing Provider  Cholecalciferol (VITAMIN D-3 PO) Take 1 tablet by mouth 2 (two) times a week.    [provider]  levocetirizine (XYZAL) 5 MG tablet 1 TABLET BY MOUTH AT BEDTIME CXL RX FOR ZYRTEC. 05/23/18   [provider]  sertraline (ZOLOFT) 50 MG tablet Take 50 mg by mouth daily. 11/23/21   [provider]      Allergies    Milk (cow), Oxycodone, Vicodin [hydrocodone-acetaminophen], and Soy allergy    Review of Systems   Review of Systems  HENT:  Positive for ear pain.   Respiratory:  Positive for cough.     Physical Exam Updated Vital Signs BP 127/83 (BP Location: Left Arm)   Pulse 83   Temp 98.1 F (36.7 C)   Resp 16   Wt 48.9 kg   SpO2 100%   BMI 18.49 kg/m  Physical Exam Vitals and nursing note reviewed.  Constitutional:      Appearance: She is well-developed.  HENT:     Head: Normocephalic.     Right Ear: Tympanic membrane normal.     Left Ear: Tympanic membrane normal.     Nose: Rhinorrhea present.     Mouth/Throat:     Mouth: Mucous membranes are moist.     Pharynx: Uvula midline. No posterior oropharyngeal erythema.     Tonsils: No tonsillar exudate.  Cardiovascular:     Rate and Rhythm: Normal rate.     Heart sounds: No murmur  heard. Pulmonary:     Effort: Pulmonary effort is normal.     Breath sounds: No wheezing, rhonchi or rales.  Musculoskeletal:     Cervical back: Normal range of motion and neck supple.  Skin:    General: Skin is warm and dry.  Neurological:     Mental Status: She is alert.     ED Results / Procedures / Treatments   Labs (all labs ordered are listed, but only abnormal results are displayed) Labs Reviewed  GROUP A STREP BY PCR  RESP PANEL BY RT-PCR (RSV, FLU A&B, COVID)  [id number]   Results for orders placed or performed during the hospital encounter of 01/08/23  Group A Strep by PCR   Specimen: Throat; Sterile Swab  Result Value Ref Range   Group A Strep by PCR NOT DETECTED NOT DETECTED  Resp panel by RT-PCR (RSV, Flu A&B, Covid) Throat   Specimen: Throat; Nasal Swab  Result Value Ref Range   SARS Coronavirus 2 by RT PCR NEGATIVE NEGATIVE   Influenza A by PCR NEGATIVE NEGATIVE   Influenza B by PCR NEGATIVE NEGATIVE   Resp Syncytial Virus by PCR NEGATIVE NEGATIVE    EKG None  Radiology No results found.  Procedures Procedures  Medications Ordered in ED Medications - No data to display  ED Course/ Medical Decision Making/ A&P Clinical Course as of 01/08/23 2323  Wed Jan 08, 2023  2321 Symptoms of URI x 3 weeks of sinus congestion, sore throat, dry cough. Family members with similar. Viral panel negative. Strep negative. Likely viral requiring supportive care.  [SU]    Clinical Course User Index [SU] Elpidio Anis, PA-C                                 Medical Decision Making          Final Clinical Impression(s) / ED Diagnoses Final diagnoses:  Viral upper respiratory tract infection    Rx / DC Orders ED Discharge Orders     None         Elpidio Anis, PA-C 01/08/23 2323    Maia Plan, MD 01/16/23 1504

## 2023-01-08 NOTE — ED Triage Notes (Signed)
Patient endorses sore throat, cough and right ear pain x 3 weeks.  Patient's family has similar symptoms.

## 2023-05-29 ENCOUNTER — Other Ambulatory Visit: Payer: Self-pay

## 2023-05-29 ENCOUNTER — Emergency Department (HOSPITAL_BASED_OUTPATIENT_CLINIC_OR_DEPARTMENT_OTHER): Admitting: Radiology

## 2023-05-29 ENCOUNTER — Encounter (HOSPITAL_BASED_OUTPATIENT_CLINIC_OR_DEPARTMENT_OTHER): Payer: Self-pay | Admitting: Emergency Medicine

## 2023-05-29 ENCOUNTER — Emergency Department (HOSPITAL_BASED_OUTPATIENT_CLINIC_OR_DEPARTMENT_OTHER)
Admission: EM | Admit: 2023-05-29 | Discharge: 2023-05-29 | Disposition: A | Discharge status: Home / Self Care | Attending: Emergency Medicine | Admitting: Emergency Medicine

## 2023-05-29 DIAGNOSIS — R109 Unspecified abdominal pain: Secondary | ICD-10-CM | POA: Insufficient documentation

## 2023-05-29 DIAGNOSIS — M546 Pain in thoracic spine: Secondary | ICD-10-CM | POA: Insufficient documentation

## 2023-05-29 LAB — CBC WITH DIFFERENTIAL/PLATELET
Abs Immature Granulocytes: 0 10*3/uL (ref 0.00–0.07)
Basophils Absolute: 0 10*3/uL (ref 0.0–0.1)
Basophils Relative: 1 %
Eosinophils Absolute: 0.1 10*3/uL (ref 0.0–0.5)
Eosinophils Relative: 1 %
HCT: 35.8 % — ABNORMAL LOW (ref 36.0–46.0)
Hemoglobin: 12.1 g/dL (ref 12.0–15.0)
Immature Granulocytes: 0 %
Lymphocytes Relative: 52 %
Lymphs Abs: 2.3 10*3/uL (ref 0.7–4.0)
MCH: 28.8 pg (ref 26.0–34.0)
MCHC: 33.8 g/dL (ref 30.0–36.0)
MCV: 85.2 fL (ref 80.0–100.0)
Monocytes Absolute: 0.6 10*3/uL (ref 0.1–1.0)
Monocytes Relative: 13 %
Neutro Abs: 1.5 10*3/uL — ABNORMAL LOW (ref 1.7–7.7)
Neutrophils Relative %: 33 %
Platelets: 227 10*3/uL (ref 150–400)
RBC: 4.2 MIL/uL (ref 3.87–5.11)
RDW: 14.8 % (ref 11.5–15.5)
WBC: 4.5 10*3/uL (ref 4.0–10.5)
nRBC: 0 % (ref 0.0–0.2)

## 2023-05-29 LAB — BASIC METABOLIC PANEL WITH GFR
Anion gap: 7 (ref 5–15)
BUN: 13 mg/dL (ref 6–20)
CO2: 24 mmol/L (ref 22–32)
Calcium: 8.8 mg/dL — ABNORMAL LOW (ref 8.9–10.3)
Chloride: 106 mmol/L (ref 98–111)
Creatinine, Ser: 0.75 mg/dL (ref 0.44–1.00)
GFR, Estimated: >60 mL/min (ref >60)
Glucose, Bld: 80 mg/dL (ref 70–99)
Potassium: 4.3 mmol/L (ref 3.5–5.1)
Sodium: 137 mmol/L (ref 135–145)

## 2023-05-29 LAB — URINALYSIS, ROUTINE W REFLEX MICROSCOPIC
Bilirubin Urine: NEGATIVE
Glucose, UA: NEGATIVE mg/dL
Hgb urine dipstick: NEGATIVE
Ketones, ur: NEGATIVE mg/dL
Leukocytes,Ua: NEGATIVE
Nitrite: NEGATIVE
Protein, ur: NEGATIVE mg/dL
Specific Gravity, Urine: 1.013 (ref 1.005–1.030)
pH: 7 (ref 5.0–8.0)

## 2023-05-29 LAB — HEPATIC FUNCTION PANEL
ALT: 14 U/L (ref 0–44)
AST: 20 U/L (ref 15–41)
Albumin: 4.4 g/dL (ref 3.5–5.0)
Alkaline Phosphatase: 43 U/L (ref 38–126)
Bilirubin, Direct: 0.1 mg/dL (ref 0.0–0.2)
Indirect Bilirubin: 0.3 mg/dL (ref 0.3–0.9)
Total Bilirubin: 0.4 mg/dL (ref 0.0–1.2)
Total Protein: 7.3 g/dL (ref 6.5–8.1)

## 2023-05-29 LAB — D-DIMER, QUANTITATIVE: D-Dimer, Quant: <0.27 ug{FEU}/mL (ref 0.00–0.50)

## 2023-05-29 LAB — PREGNANCY, URINE: Preg Test, Ur: NEGATIVE

## 2023-05-29 LAB — LIPASE, BLOOD: Lipase: 36 U/L (ref 11–51)

## 2023-05-29 LAB — TROPONIN I (HIGH SENSITIVITY): Troponin I (High Sensitivity): <2 ng/L (ref <18)

## 2023-05-29 MED ORDER — CYCLOBENZAPRINE HCL 10 MG PO TABS
10.0000 mg | ORAL_TABLET | Freq: Once | ORAL | Status: AC
  Administered 2023-05-29: 10 mg via ORAL
  Filled 2023-05-29: qty 1

## 2023-05-29 MED ORDER — KETOROLAC TROMETHAMINE 15 MG/ML IJ SOLN
15.0000 mg | Freq: Once | INTRAMUSCULAR | Status: AC
  Administered 2023-05-29: 15 mg via INTRAVENOUS
  Filled 2023-05-29: qty 1

## 2023-05-29 MED ORDER — LIDOCAINE 5 % EX PTCH: 1.0000 | MEDICATED_PATCH | CUTANEOUS | 0 refills | Status: AC

## 2023-05-29 MED ORDER — CYCLOBENZAPRINE HCL 10 MG PO TABS: 10.0000 mg | ORAL_TABLET | Freq: Two times a day (BID) | ORAL | 0 refills | Status: AC | PRN

## 2023-05-29 MED ORDER — FENTANYL CITRATE PF 50 MCG/ML IJ SOSY
50.0000 ug | PREFILLED_SYRINGE | Freq: Once | INTRAMUSCULAR | Status: AC
  Administered 2023-05-29: 50 ug via INTRAVENOUS
  Filled 2023-05-29: qty 1

## 2023-05-29 NOTE — Discharge Instructions (Signed)
 Recommend 1000 mg of Tylenol every 6 hours as needed for pain.  Recommend 400 mg ibuprofen every 8 hours as needed for pain.  Recommend lidocaine patches either prescription or over-the-counter.  Take Flexeril as prescribed.  This is a muscle relaxant.  Do not mix with alcohol drugs or dangerous activities including driving.  Follow-up with your primary care doctor if not improving.  Return if symptoms worsen as well.

## 2023-05-29 NOTE — ED Triage Notes (Signed)
 C/o R thoracic back pain x 1 day. States did a bunch of "bending over and lifting yesterday". Pain worse with movement.

## 2023-05-29 NOTE — ED Provider Notes (Signed)
 [place] EMERGENCY DEPARTMENT AT [facility] Provider Note   CSN: [id number] Arrival date & time: 05/29/23  1519     History  Chief Complaint  Patient presents with   Back Pain    Kelli Rivers is a 53 y.o. female.  Left flank pain since yesterday.  Worse with movement with taking deep breath.  No specific trauma.  Not sure if she hurt it lifting something.  She denies any cough sputum production.  No pain with urination.  No history of kidney stones.  Denies any chest pain weakness numbness tingling.  Denies any rash.  Denies any fever.  Denies any pain urination.  The history is provided by the patient.       Home Medications Prior to Admission medications   Medication Sig Start Date End Date Taking? Authorizing Provider  cyclobenzaprine (FLEXERIL) 10 MG tablet Take 1 tablet (10 mg total) by mouth 2 (two) times daily as needed for muscle spasms. 05/29/23  Yes Nael Petrosyan, DO  lidocaine (LIDODERM) 5 % Place 1 patch onto the skin daily. Remove & Discard patch within 12 hours or as directed by MD 05/29/23  Yes Tiffeny Minchew, DO  Cholecalciferol (VITAMIN D-3 PO) Take 1 tablet by mouth 2 (two) times a week.    [provider]  levocetirizine (XYZAL) 5 MG tablet 1 TABLET BY MOUTH AT BEDTIME CXL RX FOR ZYRTEC. 05/23/18   [provider]  sertraline (ZOLOFT) 50 MG tablet Take 50 mg by mouth daily. 11/23/21   [provider]      Allergies    Milk (cow), Oxycodone, Vicodin [hydrocodone-acetaminophen], and Soy allergy (obsolete)    Review of Systems   Review of Systems  Physical Exam Updated Vital Signs BP 135/77   Pulse 76   Temp 98.2 F (36.8 C)   Resp 18   SpO2 100%  Physical Exam Vitals and nursing note reviewed.  Constitutional:      General: She is not in acute distress.    Appearance: She is well-developed. She is not ill-appearing.  HENT:     Head: Normocephalic and atraumatic.     Nose: Nose normal.     Mouth/Throat:      Mouth: Mucous membranes are moist.  Eyes:     Extraocular Movements: Extraocular movements intact.     Conjunctiva/sclera: Conjunctivae normal.     Pupils: Pupils are equal, round, and reactive to light.  Cardiovascular:     Rate and Rhythm: Normal rate and regular rhythm.     Pulses: Normal pulses.     Heart sounds: Normal heart sounds. No murmur heard. Pulmonary:     Effort: Pulmonary effort is normal. No respiratory distress.     Breath sounds: Normal breath sounds.  Abdominal:     Palpations: Abdomen is soft.     Tenderness: There is no abdominal tenderness. There is left CVA tenderness.  Musculoskeletal:        General: No swelling.     Cervical back: Neck supple.  Skin:    General: Skin is warm and dry.     Capillary Refill: Capillary refill takes less than 2 seconds.  Neurological:     General: No focal deficit present.     Mental Status: She is alert and oriented to person, place, and time.     Cranial Nerves: No cranial nerve deficit.     Sensory: No sensory deficit.     Motor: No weakness.     Coordination: Coordination normal.  Psychiatric:        Mood and Affect: Mood normal.     ED Results / Procedures / Treatments   Labs (all labs ordered are listed, but only abnormal results are displayed) Labs Reviewed  CBC WITH DIFFERENTIAL/PLATELET - Abnormal; Notable for the following components:      Result Value   HCT 35.8 (*)    Neutro Abs 1.5 (*)    All other components within normal limits  BASIC METABOLIC PANEL WITH GFR - Abnormal; Notable for the following components:   Calcium 8.8 (*)    All other components within normal limits  URINALYSIS, ROUTINE W REFLEX MICROSCOPIC - Abnormal; Notable for the following components:   Color, Urine COLORLESS (*)    All other components within normal limits  PREGNANCY, URINE  HEPATIC FUNCTION PANEL  LIPASE, BLOOD  D-DIMER, QUANTITATIVE (NOT AT [facility])  TROPONIN I (HIGH SENSITIVITY)    EKG None  Radiology DG Chest 2  View Result Date: 05/29/2023 CLINICAL DATA:  Pain. EXAM: CHEST - 2 VIEW COMPARISON:  None Available. FINDINGS: Bilateral lung fields are clear. Bilateral costophrenic angles are clear. Normal cardio-mediastinal silhouette. No acute osseous abnormalities. The soft tissues are within normal limits. IMPRESSION: No active cardiopulmonary disease. Electronically Signed   By: Kelli Rivers M.D.   On: 05/29/2023 16:35    Procedures Procedures    Medications Ordered in ED Medications  cyclobenzaprine (FLEXERIL) tablet 10 mg (has no administration in time range)  ketorolac (TORADOL) 15 MG/ML injection 15 mg (has no administration in time range)  fentaNYL (SUBLIMAZE) injection 50 mcg (50 mcg Intravenous Given 05/29/23 1923)    ED Course/ Medical Decision Making/ A&P                                 Medical Decision Making Amount and/or Complexity of Data Reviewed Labs: ordered. Radiology: ordered.  Risk Prescription drug management.   [name] is here with left flank pain.  Normal vitals.  No fever.  No significant medical history.  Differential diagnosis includes kidney stone UTI PE ACS pneumonia pneumothorax MSK process.  Will get CBC CMP lipase urinalysis urine Prag chest x-ray give a dose of IV fentanyl will get D-dimer EKG troponin.  EKG shows sinus rhythm.  No ischemic changes per my review interpretation.  Lab work otherwise no significant leukocytosis anemia electrolyte abnormality kidney injury.  D-dimer normal.  Urinalysis negative for infection.  There is no blood in the urine.  Seems less likely to be a kidney stone.  She is point tender over the left posterior ribs.  Overall chest x-ray shows no obvious pneumonia pneumothorax or rib fractures.  Lab work and imaging overall unremarkable.  She is feeling better.  I do think that this is a muscular process.  Will prescribe lidocaine patch Flexeril and recommend continued use of Tylenol and ibuprofen at home.  Discharged in good  condition.  Understands return precautions.  No concern for ACS or PE other acute process at this time.  This chart was dictated using voice recognition software.  Despite best efforts to proofread,  errors can occur which can change the documentation meaning.         Final Clinical Impression(s) / ED Diagnoses Final diagnoses:  Acute left-sided thoracic back pain    Rx / DC Orders ED Discharge Orders          Ordered    cyclobenzaprine (FLEXERIL) 10 MG  tablet  2 times daily PRN        05/29/23 2015    lidocaine (LIDODERM) 5 %  Every 24 hours        05/29/23 2015              Virgina Norfolk, DO 05/29/23 2017
